# Patient Record
Sex: Female | Born: 1975 | Race: White | Hispanic: No | Marital: Married | Smoking: Never smoker
Health system: Southern US, Community
[De-identification: ages and names within clinical notes are randomized; demographics above are authoritative.]

## PROBLEM LIST (undated history)

## (undated) DIAGNOSIS — F419 Anxiety disorder, unspecified: Secondary | ICD-10-CM

## (undated) DIAGNOSIS — I10 Essential (primary) hypertension: Secondary | ICD-10-CM

## (undated) HISTORY — PX: WISDOM TOOTH EXTRACTION: SHX21

## (undated) HISTORY — DX: Anxiety disorder, unspecified: F41.9

## (undated) HISTORY — PX: OTHER SURGICAL HISTORY: SHX169

---

## 2018-05-17 ENCOUNTER — Emergency Department (HOSPITAL_COMMUNITY)
Admission: EM | Admit: 2018-05-17 | Discharge: 2018-05-17 | Disposition: A | Discharge status: Home / Self Care | Payer: BC Managed Care – PPO | Attending: Emergency Medicine | Admitting: Emergency Medicine

## 2018-05-17 ENCOUNTER — Emergency Department (HOSPITAL_COMMUNITY): Payer: BC Managed Care – PPO

## 2018-05-17 ENCOUNTER — Encounter (HOSPITAL_COMMUNITY): Payer: Self-pay | Admitting: Emergency Medicine

## 2018-05-17 DIAGNOSIS — R1031 Right lower quadrant pain: Secondary | ICD-10-CM | POA: Diagnosis present

## 2018-05-17 DIAGNOSIS — R030 Elevated blood-pressure reading, without diagnosis of hypertension: Secondary | ICD-10-CM

## 2018-05-17 DIAGNOSIS — N200 Calculus of kidney: Secondary | ICD-10-CM | POA: Insufficient documentation

## 2018-05-17 LAB — CBC
HCT: 40.5 % (ref 36.0–46.0)
Hemoglobin: 13.2 g/dL (ref 12.0–15.0)
MCH: 30.5 pg (ref 26.0–34.0)
MCHC: 32.6 g/dL (ref 30.0–36.0)
MCV: 93.5 fL (ref 80.0–100.0)
Platelets: 270 10*3/uL (ref 150–400)
RBC: 4.33 MIL/uL (ref 3.87–5.11)
RDW: 12.6 % (ref 11.5–15.5)
WBC: 5.7 10*3/uL (ref 4.0–10.5)
nRBC: 0 % (ref 0.0–0.2)

## 2018-05-17 LAB — COMPREHENSIVE METABOLIC PANEL
ALT: 15 U/L (ref 0–44)
AST: 20 U/L (ref 15–41)
Albumin: 3.9 g/dL (ref 3.5–5.0)
Alkaline Phosphatase: 71 U/L (ref 38–126)
Anion gap: 15 (ref 5–15)
BUN: 11 mg/dL (ref 6–20)
CO2: 21 mmol/L — ABNORMAL LOW (ref 22–32)
Calcium: 9.2 mg/dL (ref 8.9–10.3)
Chloride: 106 mmol/L (ref 98–111)
Creatinine, Ser: 0.97 mg/dL (ref 0.44–1.00)
GFR calc Af Amer: >60 mL/min (ref >60)
GFR calc non Af Amer: >60 mL/min (ref >60)
Glucose, Bld: 128 mg/dL — ABNORMAL HIGH (ref 70–99)
Potassium: 3.3 mmol/L — ABNORMAL LOW (ref 3.5–5.1)
Sodium: 142 mmol/L (ref 135–145)
Total Bilirubin: 0.3 mg/dL (ref 0.3–1.2)
Total Protein: 7.1 g/dL (ref 6.5–8.1)

## 2018-05-17 LAB — URINALYSIS, ROUTINE W REFLEX MICROSCOPIC
BILIRUBIN URINE: NEGATIVE
Bacteria, UA: NONE SEEN
Glucose, UA: NEGATIVE mg/dL
Ketones, ur: NEGATIVE mg/dL
Leukocytes,Ua: NEGATIVE
Nitrite: NEGATIVE
Protein, ur: NEGATIVE mg/dL
Specific Gravity, Urine: 1.013 (ref 1.005–1.030)
pH: 6 (ref 5.0–8.0)

## 2018-05-17 LAB — LIPASE, BLOOD: LIPASE: 28 U/L (ref 11–51)

## 2018-05-17 LAB — I-STAT BETA HCG BLOOD, ED (MC, WL, AP ONLY): I-stat hCG, quantitative: <5 m[IU]/mL (ref <5)

## 2018-05-17 MED ORDER — DICLOFENAC SODIUM ER 100 MG PO TB24: 100.0000 mg | ORAL_TABLET | Freq: Every day | ORAL | 0 refills | Status: AC

## 2018-05-17 MED ORDER — OXYCODONE-ACETAMINOPHEN 5-325 MG PO TABS: 1.0000 | ORAL_TABLET | Freq: Four times a day (QID) | ORAL | 0 refills | Status: DC | PRN

## 2018-05-17 MED ORDER — ONDANSETRON 4 MG PO TBDP: 4.0000 mg | ORAL_TABLET | Freq: Three times a day (TID) | ORAL | 0 refills | Status: DC | PRN

## 2018-05-17 MED ORDER — KETOROLAC TROMETHAMINE 30 MG/ML IJ SOLN
30.0000 mg | Freq: Once | INTRAMUSCULAR | Status: AC
  Administered 2018-05-17: 30 mg via INTRAVENOUS
  Filled 2018-05-17: qty 1

## 2018-05-17 MED ORDER — TAMSULOSIN HCL 0.4 MG PO CAPS: 0.4000 mg | ORAL_CAPSULE | Freq: Every day | ORAL | 0 refills | Status: AC

## 2018-05-17 MED ORDER — SODIUM CHLORIDE 0.9% FLUSH
3.0000 mL | Freq: Once | INTRAVENOUS | Status: AC
  Administered 2018-05-17: 3 mL via INTRAVENOUS

## 2018-05-17 MED ORDER — SODIUM CHLORIDE 0.9 % IV BOLUS
1000.0000 mL | Freq: Once | INTRAVENOUS | Status: AC
  Administered 2018-05-17: 1000 mL via INTRAVENOUS

## 2018-05-17 NOTE — Discharge Instructions (Addendum)
Your evaluated today for flank and abdominal pain.  Your CT scan did show evidence of a kidney stone on the right side.    I have prescribed Voltaren.  Please take this once daily in the morning.  Please take this with additional of a high carb meal.  I have also given you Percocet for breakthrough pain.  Please only take as needed.  Do not drive or operate heavy machinery while taking this medication.  I have also given you Zofran for nausea.  I would suggest taking this when you first wake up in the morning.  Also prescribed you Flomax.  Please make sure to sit on the edge of the bed before standing as this may cause dizziness.  We have provided you with a strainer.  Please continue to strain your urine until your symptoms resolve.  If you notice you pass a stone please place this in a bag and bring this to your follow-up appointment.  Follow-up with [facility].  Their phone number is on your discharge paperwork.  You need to call to schedule an appointment.  You had elevated blood pressure in the emergency department.  Please follow-up with a primary care provider for reevaluation.  I have also given you a Dash diet.  Return to the ED for any new or worsening symptoms.

## 2018-05-17 NOTE — ED Provider Notes (Signed)
[facility]   CSN: [id number] Arrival date & time: 05/17/18  [id number]  History   Chief Complaint Chief Complaint  Patient presents with  . Abdominal Pain   HPI Tanya Raymond is a 43 y.o. female with no significant past medical history who presents for evaluation of abdominal pain.  Patient states she was awoken in the middle of the night with right-sided flank pain which radiated to her right lower abdomen.  States pain is intermittent nature.  Pain on arrival was 10/10.  Her current pain is a 3/10.  Describes it as a sharp stabbing as well as an aching pain. Denies previous history of kidney stones.  Does have prior history of pyelonephritis.  States she is also had increased urgency to urinate, however has not had difficulty with urination.  No fever, chills, chest pain, shortness of breath, diarrhea, constipation, pelvic pain, vaginal discharge, hematuria, dysuria.  Patient states she is also had nausea as well as 2 episodes of nonbloody, nonbilious emesis.  No prior abdominal surgeries.  History provided by patient.  No interpreter was used.  HPI  History reviewed. No pertinent past medical history.  There are no active problems to display for this patient.   History reviewed. No pertinent surgical history.   OB History   No obstetric history on file.      Home Medications    Prior to Admission medications   Medication Sig Start Date End Date Taking? Authorizing Provider  Diclofenac Sodium CR 100 MG 24 hr tablet Take 1 tablet (100 mg total) by mouth daily for 7 days. 05/17/18 05/24/18  Naomy Esham A, PA-C  ondansetron (ZOFRAN ODT) 4 MG disintegrating tablet Take 1 tablet (4 mg total) by mouth every 8 (eight) hours as needed for nausea or vomiting. 05/17/18   Jojuan Champney A, PA-C  oxyCODONE-acetaminophen (PERCOCET/ROXICET) 5-325 MG tablet Take 1-2 tablets by mouth every 6 (six) hours as needed for severe pain. 05/17/18    Caelynn Marshman A, PA-C  tamsulosin (FLOMAX) 0.4 MG CAPS capsule Take 1 capsule (0.4 mg total) by mouth daily for 7 days. 05/17/18 05/24/18  Lynsey Ange A, PA-C    Family History No family history on file.  Social History Social History   Tobacco Use  . Smoking status: Never Smoker  . Smokeless tobacco: Never Used  Substance Use Topics  . Alcohol use: Yes    Comment: Socially   . Drug use: Not Currently     Allergies   Patient has no known allergies.   Review of Systems Review of Systems  Constitutional: Negative.   HENT: Negative.   Respiratory: Negative.   Cardiovascular: Negative.   Gastrointestinal: Positive for abdominal pain, nausea and vomiting. Negative for abdominal distention, anal bleeding, blood in stool, constipation, diarrhea and rectal pain.  Genitourinary: Positive for flank pain and urgency. Negative for decreased urine volume, difficulty urinating, dysuria, vaginal bleeding, vaginal discharge and vaginal pain.  Skin: Negative.   Neurological: Negative.   All other systems reviewed and are negative.    Physical Exam Updated Vital Signs BP (!) 144/96 (BP Location: Right Arm)   Pulse 80   Temp 98.8 F (37.1 C) (Oral)   Resp 18   Ht 5\' 6"  (1.676 m)   Wt 95.3 kg   LMP 04/23/2018   SpO2 100%   BMI 33.89 kg/m   Physical Exam Vitals signs and nursing note reviewed.  Constitutional:      General: She is not in  acute distress.    Appearance: She is well-developed. She is not ill-appearing, toxic-appearing or diaphoretic.  HENT:     Head: Normocephalic and atraumatic.     Mouth/Throat:     Comments: Posterior oropharynx clear.  Mucous membranes moist. Eyes:     Pupils: Pupils are equal, round, and reactive to light.  Neck:     Musculoskeletal: Normal range of motion.  Cardiovascular:     Rate and Rhythm: Normal rate.     Heart sounds: Normal heart sounds.  Pulmonary:     Effort: No respiratory distress.     Comments: Clear to  auscultation bilaterally without wheeze, rhonchi or rales.  Able to speak in full sentences without difficulty. Abdominal:     General: There is no distension.     Comments: Soft, Nontender without rebound or guarding.  No CVA tenderness.  Negative McBurney's point, negative psoas obturator sign. Normoactive bowel sounds  Musculoskeletal: Normal range of motion.  Skin:    General: Skin is warm and dry.     Comments: No rashes or lesions   Neurological:     Mental Status: She is alert.      ED Treatments / Results  Labs (all labs ordered are listed, but only abnormal results are displayed) Labs Reviewed  COMPREHENSIVE METABOLIC PANEL - Abnormal; Notable for the following components:      Result Value   Potassium 3.3 (*)    CO2 21 (*)    Glucose, Bld 128 (*)    All other components within normal limits  URINALYSIS, ROUTINE W REFLEX MICROSCOPIC - Abnormal; Notable for the following components:   Color, Urine STRAW (*)    Hgb urine dipstick MODERATE (*)    All other components within normal limits  LIPASE, BLOOD  CBC  I-STAT BETA HCG BLOOD, ED ([facility], [facility], [facility] ONLY)    EKG None  Radiology Ct Renal Stone Study  Result Date: 05/17/2018 CLINICAL DATA:  Right lower quadrant pain EXAM: CT ABDOMEN AND PELVIS WITHOUT CONTRAST TECHNIQUE: Multidetector CT imaging of the abdomen and pelvis was performed following the standard protocol without IV contrast. COMPARISON:  None. FINDINGS: Lower chest:  No significant finding Hepatobiliary: No focal liver abnormality.No evidence of biliary obstruction or stone. Pancreas: Unremarkable. Spleen: Unremarkable. Adrenals/Urinary Tract: Negative adrenals. 5 x 3 mm stone at the right UVJ with hydroureteronephrosis. Three right and single left intrarenal calculi measuring up to 4 mm on the right. Unremarkable bladder. Stomach/Bowel: Small bowel is clustered in the right abdomen and the colon on the left. The ileocecal valve is in the low abdominal midline. No  appendicitis. Vascular/Lymphatic: No acute vascular abnormality. No mass or adenopathy. Reproductive:Negative. Other: No ascites or pneumoperitoneum. Musculoskeletal: L4-5 focal degenerative disc narrowing. IMPRESSION: 1. Right hydroureteronephrosis due to 5 x 3 mm stone at the UVJ. 2. Right more than left nephrolithiasis. 3. Congenital non rotation of bowel. Electronically Signed   By: Marnee Spring M.D.   On: 05/17/2018 07:26    Procedures Procedures (including critical care time)  Medications Ordered in ED Medications  sodium chloride flush (NS) 0.9 % injection 3 mL (3 mLs Intravenous Given 05/17/18 0642)  sodium chloride 0.9 % bolus 1,000 mL (0 mLs Intravenous Stopped 05/17/18 0802)  ketorolac (TORADOL) 30 MG/ML injection 30 mg (30 mg Intravenous Given 05/17/18 4098)   Initial Impression / Assessment and Plan / ED Course  I have reviewed the triage vital signs and the nursing notes.  Pertinent labs & imaging results that were available during  my care of the patient were reviewed by me and considered in my medical decision making (see chart for details).  43 year old female who appears otherwise well presents for evaluation of right-sided flank pain as well as right-sided abdominal pain.  Afebrile, nonseptic, non-ill-appearing.  Pain intermittent in nature described as stabbing and aching.  No history of kidney stones. Abdomen soft, nontender without rebound or guarding.  Negative McBurney point, negative psoas obturator sign.  Episodes of nausea as well as nonbloody, nonbilious emesis, last episode at 3am.  Has not taken anything for pain. Urinary urgency without dysuria.  Labs ordered from triage.  CBC without leukocytosis, and Bolick panel with mild hypo-kalemia 3.3, mild elevation in glucose at 128, lipase 28, hCG negative.  Urinalysis without evidence of infection, moderate blood.  Will obtain CT renal stone and reevaluate. Low suspicion for torsion, appendicitis at this time.  0730: CT  scan with 4 mm stone with hydroureteronephrosis.  Able to urinate without difficulty.  Patient states her pain is now a 2/10 after Toradol.  Creatinine within normal limits.  No episodes of emesis in department.  Denies nausea.  Discussed follow-up with urology for kidney stone.  Patient will be given antiemetics, pain medication as well as strainer.  Able to tolerate p.o. intake in department without difficulty.  Patient has been hypertensive in department.  Blood pressure with improvement after pain medication.  Does not have history of hypertension.  Asymptomatic without headache, vision changes, chest pain, shortness of breath.  Discussed follow-up with PCP for reevaluation for her blood pressure.  Patient given DASH diet at discharge.  Patient hemodynamically stable and appropriate for DC home at this time.  I have discussed return precautions.  Patient voiced understanding and is agreeable for follow-up.    Final Clinical Impressions(s) / ED Diagnoses   Final diagnoses:  Nephrolithiasis  Elevated blood-pressure reading without diagnosis of hypertension    ED Discharge Orders         Ordered    Diclofenac Sodium CR 100 MG 24 hr tablet  Daily     05/17/18 0759    oxyCODONE-acetaminophen (PERCOCET/ROXICET) 5-325 MG tablet  Every 6 hours PRN     05/17/18 0759    ondansetron (ZOFRAN ODT) 4 MG disintegrating tablet  Every 8 hours PRN     05/17/18 0759    tamsulosin (FLOMAX) 0.4 MG CAPS capsule  Daily     05/17/18 0759           Lincoln Ginley A, PA-C 05/17/18 0817    Palumbo, April, MD 05/17/18 2310

## 2018-05-17 NOTE — ED Triage Notes (Signed)
Pt reports RLQ abd pain, N/V that woke her up from sleep. Pt appears to be in discomfort.

## 2018-05-17 NOTE — ED Notes (Signed)
Gave pt strainer for urine. Pt received discharge instructions/prescriptions. Pt agreeable to discharge and understands instructions. E signature not available.

## 2018-12-25 ENCOUNTER — Other Ambulatory Visit: Payer: Self-pay

## 2018-12-27 ENCOUNTER — Encounter: Payer: Self-pay | Admitting: Obstetrics and Gynecology

## 2019-01-17 ENCOUNTER — Encounter: Payer: Self-pay | Admitting: Obstetrics and Gynecology

## 2019-01-17 ENCOUNTER — Ambulatory Visit: Payer: BC Managed Care – PPO | Admitting: Obstetrics and Gynecology

## 2019-01-17 ENCOUNTER — Other Ambulatory Visit: Payer: Self-pay

## 2019-01-17 ENCOUNTER — Other Ambulatory Visit: Payer: Self-pay | Admitting: Obstetrics and Gynecology

## 2019-01-17 ENCOUNTER — Other Ambulatory Visit: Payer: Self-pay | Admitting: Internal Medicine

## 2019-01-17 VITALS — BP 140/86 | HR 80 | Temp 97.1°F | Ht 66.0 in | Wt 225.8 lb

## 2019-01-17 DIAGNOSIS — Z01419 Encounter for gynecological examination (general) (routine) without abnormal findings: Secondary | ICD-10-CM

## 2019-01-17 DIAGNOSIS — Z1231 Encounter for screening mammogram for malignant neoplasm of breast: Secondary | ICD-10-CM

## 2019-01-17 DIAGNOSIS — Z6836 Body mass index (BMI) 36.0-36.9, adult: Secondary | ICD-10-CM

## 2019-01-17 DIAGNOSIS — Z124 Encounter for screening for malignant neoplasm of cervix: Secondary | ICD-10-CM | POA: Diagnosis not present

## 2019-01-17 DIAGNOSIS — Z3009 Encounter for other general counseling and advice on contraception: Secondary | ICD-10-CM

## 2019-01-17 DIAGNOSIS — R030 Elevated blood-pressure reading, without diagnosis of hypertension: Secondary | ICD-10-CM

## 2019-01-17 NOTE — Patient Instructions (Addendum)
EXERCISE AND DIET:  We recommended that you start or continue a regular exercise program for good health. Regular exercise means any activity that makes your heart beat faster and makes you sweat.  We recommend exercising at least 30 minutes per day at least 3 days a week, preferably 4 or 5.  We also recommend a diet low in fat and sugar.  Inactivity, poor dietary choices and obesity can cause diabetes, heart attack, stroke, and kidney damage, among others.    ALCOHOL AND SMOKING:  Women should limit their alcohol intake to no more than 7 drinks/beers/glasses of wine (combined, not each!) per week. Moderation of alcohol intake to this level decreases your risk of breast cancer and liver damage. And of course, no recreational drugs are part of a healthy lifestyle.  And absolutely no smoking or even second hand smoke. Most people know smoking can cause heart and lung diseases, but did you know it also contributes to weakening of your bones? Aging of your skin?  Yellowing of your teeth and nails?  CALCIUM AND VITAMIN D:  Adequate intake of calcium and Vitamin D are recommended.  The recommendations for exact amounts of these supplements seem to change often, but generally speaking 1,000 mg of calcium (between diet and supplement) and 800 units of Vitamin D per day seems prudent. Certain women may benefit from higher intake of Vitamin D.  If you are among these women, your doctor will have told you during your visit.    PAP SMEARS:  Pap smears, to check for cervical cancer or precancers,  have traditionally been done yearly, although recent scientific advances have shown that most women can have pap smears less often.  However, every woman still should have a physical exam from her gynecologist every year. It will include a breast check, inspection of the vulva and vagina to check for abnormal growths or skin changes, a visual exam of the cervix, and then an exam to evaluate the size and shape of the uterus and  ovaries.  And after 40 years of age, a rectal exam is indicated to check for rectal cancers. We will also provide age appropriate advice regarding health maintenance, like when you should have certain vaccines, screening for sexually transmitted diseases, bone density testing, colonoscopy, mammograms, etc.   MAMMOGRAMS:  All women over 40 years old should have a yearly mammogram. Many facilities now offer a "3D" mammogram, which may cost around $50 extra out of pocket. If possible,  we recommend you accept the option to have the 3D mammogram performed.  It both reduces the number of women who will be called back for extra views which then turn out to be normal, and it is better than the routine mammogram at detecting truly abnormal areas.    COLON CANCER SCREENING: Now recommend starting at age 45. At this time colonoscopy is not covered for routine screening until 50. There are take home tests that can be done between 45-49.   COLONOSCOPY:  Colonoscopy to screen for colon cancer is recommended for all women at age 50.  We know, you hate the idea of the prep.  We agree, BUT, having colon cancer and not knowing it is worse!!  Colon cancer so often starts as a polyp that can be seen and removed at colonscopy, which can quite literally save your life!  And if your first colonoscopy is normal and you have no family history of colon cancer, most women don't have to have it again for   10 years.  Once every ten years, you can do something that may end up saving your life, right?  We will be happy to help you get it scheduled when you are ready.  Be sure to check your insurance coverage so you understand how much it will cost.  It may be covered as a preventative service at no cost, but you should check your particular policy.      Breast Self-Awareness Breast self-awareness means being familiar with how your breasts look and feel. It involves checking your breasts regularly and reporting any changes to your  health care provider. Practicing breast self-awareness is important. A change in your breasts can be a sign of a serious medical problem. Being familiar with how your breasts look and feel allows you to find any problems early, when treatment is more likely to be successful. All women should practice breast self-awareness, including women who have had breast implants. How to do a breast self-exam One way to learn what is normal for your breasts and whether your breasts are changing is to do a breast self-exam. To do a breast self-exam: Look for Changes  1. Remove all the clothing above your waist. 2. Stand in front of a mirror in a room with good lighting. 3. Put your hands on your hips. 4. Push your hands firmly downward. 5. Compare your breasts in the mirror. Look for differences between them (asymmetry), such as: ? Differences in shape. ? Differences in size. ? Puckers, dips, and bumps in one breast and not the other. 6. Look at each breast for changes in your skin, such as: ? Redness. ? Scaly areas. 7. Look for changes in your nipples, such as: ? Discharge. ? Bleeding. ? Dimpling. ? Redness. ? A change in position. Feel for Changes Carefully feel your breasts for lumps and changes. It is best to do this while lying on your back on the floor and again while sitting or standing in the shower or tub with soapy water on your skin. Feel each breast in the following way:  Place the arm on the side of the breast you are examining above your head.  Feel your breast with the other hand.  Start in the nipple area and make  inch (2 cm) overlapping circles to feel your breast. Use the pads of your three middle fingers to do this. Apply light pressure, then medium pressure, then firm pressure. The light pressure will allow you to feel the tissue closest to the skin. The medium pressure will allow you to feel the tissue that is a little deeper. The firm pressure will allow you to feel the tissue  close to the ribs.  Continue the overlapping circles, moving downward over the breast until you feel your ribs below your breast.  Move one finger-width toward the center of the body. Continue to use the  inch (2 cm) overlapping circles to feel your breast as you move slowly up toward your collarbone.  Continue the up and down exam using all three pressures until you reach your armpit.  Write Down What You Find  Write down what is normal for each breast and any changes that you find. Keep a written record with breast changes or normal findings for each breast. By writing this information down, you do not need to depend only on memory for size, tenderness, or location. Write down where you are in your menstrual cycle, if you are still menstruating. If you are having trouble noticing differences   in your breasts, do not get discouraged. With time you will become more familiar with the variations in your breasts and more comfortable with the exam. How often should I examine my breasts? Examine your breasts every month. If you are breastfeeding, the best time to examine your breasts is after a feeding or after using a breast pump. If you menstruate, the best time to examine your breasts is 5-7 days after your period is over. During your period, your breasts are lumpier, and it may be more difficult to notice changes. When should I see my health care provider? See your health care provider if you notice:  A change in shape or size of your breasts or nipples.  A change in the skin of your breast or nipples, such as a reddened or scaly area.  Unusual discharge from your nipples.  A lump or thick area that was not there before.  Pain in your breasts.  Anything that concerns you.  Hypertension, Adult High blood pressure (hypertension) is when the force of blood pumping through the arteries is too strong. The arteries are the blood vessels that carry blood from the heart throughout the body.  Hypertension forces the heart to work harder to pump blood and may cause arteries to become narrow or stiff. Untreated or uncontrolled hypertension can cause a heart attack, heart failure, a stroke, kidney disease, and other problems. A blood pressure reading consists of a higher number over a lower number. Ideally, your blood pressure should be below 120/80. The first ("top") number is called the systolic pressure. It is a measure of the pressure in your arteries as your heart beats. The second ("bottom") number is called the diastolic pressure. It is a measure of the pressure in your arteries as the heart relaxes. What are the causes? The exact cause of this condition is not known. There are some conditions that result in or are related to high blood pressure. What increases the risk? Some risk factors for high blood pressure are under your control. The following factors may make you more likely to develop this condition:  Smoking.  Having type 2 diabetes mellitus, high cholesterol, or both.  Not getting enough exercise or physical activity.  Being overweight.  Having too much fat, sugar, calories, or salt (sodium) in your diet.  Drinking too much alcohol. Some risk factors for high blood pressure may be difficult or impossible to change. Some of these factors include:  Having chronic kidney disease.  Having a family history of high blood pressure.  Age. Risk increases with age.  Race. You may be at higher risk if you are African American.  Gender. Men are at higher risk than women before age 31. After age 80, women are at higher risk than men.  Having obstructive sleep apnea.  Stress. What are the signs or symptoms? High blood pressure may not cause symptoms. Very high blood pressure (hypertensive crisis) may cause:  Headache.  Anxiety.  Shortness of breath.  Nosebleed.  Nausea and vomiting.  Vision changes.  Severe chest pain.  Seizures. How is this diagnosed?  This condition is diagnosed by measuring your blood pressure while you are seated, with your arm resting on a flat surface, your legs uncrossed, and your feet flat on the floor. The cuff of the blood pressure monitor will be placed directly against the skin of your upper arm at the level of your heart. It should be measured at least twice using the same arm. Certain conditions can  cause a difference in blood pressure between your right and left arms. Certain factors can cause blood pressure readings to be lower or higher than normal for a short period of time:  When your blood pressure is higher when you are in a health care provider's office than when you are at home, this is called white coat hypertension. Most people with this condition do not need medicines.  When your blood pressure is higher at home than when you are in a health care provider's office, this is called masked hypertension. Most people with this condition may need medicines to control blood pressure. If you have a high blood pressure reading during one visit or you have normal blood pressure with other risk factors, you may be asked to:  Return on a different day to have your blood pressure checked again.  Monitor your blood pressure at home for 1 week or longer. If you are diagnosed with hypertension, you may have other blood or imaging tests to help your health care provider understand your overall risk for other conditions. How is this treated? This condition is treated by making healthy lifestyle changes, such as eating healthy foods, exercising more, and reducing your alcohol intake. Your health care provider may prescribe medicine if lifestyle changes are not enough to get your blood pressure under control, and if:  Your systolic blood pressure is above 130.  Your diastolic blood pressure is above 80. Your personal target blood pressure may vary depending on your medical conditions, your age, and other factors. Follow  these instructions at home: Eating and drinking   Eat a diet that is high in fiber and potassium, and low in sodium, added sugar, and fat. An example eating plan is called the DASH (Dietary Approaches to Stop Hypertension) diet. To eat this way: ? Eat plenty of fresh fruits and vegetables. Try to fill one half of your plate at each meal with fruits and vegetables. ? Eat whole grains, such as whole-wheat pasta, brown rice, or whole-grain bread. Fill about one fourth of your plate with whole grains. ? Eat or drink low-fat dairy products, such as skim milk or low-fat yogurt. ? Avoid fatty cuts of meat, processed or cured meats, and poultry with skin. Fill about one fourth of your plate with lean proteins, such as fish, chicken without skin, beans, eggs, or tofu. ? Avoid pre-made and processed foods. These tend to be higher in sodium, added sugar, and fat.  Reduce your daily sodium intake. Most people with hypertension should eat less than 1,500 mg of sodium a day.  Do not drink alcohol if: ? Your health care provider tells you not to drink. ? You are pregnant, may be pregnant, or are planning to become pregnant.  If you drink alcohol: ? Limit how much you use to:  0-1 drink a day for women.  0-2 drinks a day for men. ? Be aware of how much alcohol is in your drink. In the [place] one drink equals one 12 oz bottle of beer (355 mL), one 5 oz glass of wine (148 mL), or one 1 oz glass of hard liquor (44 mL). Lifestyle   Work with your health care provider to maintain a healthy body weight or to lose weight. Ask what an ideal weight is for you.  Get at least 30 minutes of exercise most days of the week. Activities may include walking, swimming, or biking.  Include exercise to strengthen your muscles (resistance exercise), such as Pilates or lifting  weights, as part of your weekly exercise routine. Try to do these types of exercises for 30 minutes at least 3 days a week.  Do not use any  products that contain nicotine or tobacco, such as cigarettes, e-cigarettes, and chewing tobacco. If you need help quitting, ask your health care provider.  Monitor your blood pressure at home as told by your health care provider.  Keep all follow-up visits as told by your health care provider. This is important. Medicines  Take over-the-counter and prescription medicines only as told by your health care provider. Follow directions carefully. Blood pressure medicines must be taken as prescribed.  Do not skip doses of blood pressure medicine. Doing this puts you at risk for problems and can make the medicine less effective.  Ask your health care provider about side effects or reactions to medicines that you should watch for. Contact a health care provider if you:  Think you are having a reaction to a medicine you are taking.  Have headaches that keep coming back (recurring).  Feel dizzy.  Have swelling in your ankles.  Have trouble with your vision. Get help right away if you:  Develop a severe headache or confusion.  Have unusual weakness or numbness.  Feel faint.  Have severe pain in your chest or abdomen.  Vomit repeatedly.  Have trouble breathing. Summary  Hypertension is when the force of blood pumping through your arteries is too strong. If this condition is not controlled, it may put you at risk for serious complications.  Your personal target blood pressure may vary depending on your medical conditions, your age, and other factors. For most people, a normal blood pressure is less than 120/80.  Hypertension is treated with lifestyle changes, medicines, or a combination of both. Lifestyle changes include losing weight, eating a healthy, low-sodium diet, exercising more, and limiting alcohol. This information is not intended to replace advice given to you by your health care provider. Make sure you discuss any questions you have with your health care provider. Document  Released: 03/21/2005 Document Revised: 11/29/2017 Document Reviewed: 11/29/2017 Elsevier Patient Education  2020 [employer]

## 2019-01-17 NOTE — Progress Notes (Addendum)
43 y.o. [id number] White or Caucasian Not Hispanic or Latino female here for annual exam.  On OCP's. No dyspareunia.   Her BP has been high recently. She has an appointment to be evaluated by a primary. She has diet and is exercising, has lost ~7 lbs. Doing low carb.   Period Cycle (Days): 28 Period Duration (Days): 3-4 days Period Pattern: Regular Menstrual Flow: Moderate Menstrual Control: Tampon Menstrual Control Change Freq (Hours): changes tampon every 4 hours Dysmenorrhea: None  Patient's last menstrual period was 01/08/2019 (exact date).          Sexually active: Yes.    The current method of family planning is OCP (estrogen/progesterone).    Exercising: Yes.    walking Smoker:  no  Health Maintenance: Pap:  2019 WNL per patient  History of abnormal Pap:  no MMG:  Never  BMD:   Never Colonoscopy: Never TDaP:  UTD per patient Gardasil: No   reports that she has never smoked. She has never used smokeless tobacco. She reports current alcohol use. She reports previous drug use. Social ETOH. She works in Advertising account executive, Arts administrator. Daughter is 8. Husband is a Professor  Past Medical History:  Diagnosis Date  . Anxiety   Doing well, better than when she was on medication.  History reviewed. No pertinent surgical history.  Current Outpatient Medications  Medication Sig Dispense Refill  . MAGNESIUM PO Take by mouth.    . Multiple Vitamin (MULTIVITAMIN PO) Take by mouth.    . norethindrone-ethinyl estradiol (JUNEL FE 1/20) 1-20 MG-MCG tablet Take 1 tablet by mouth daily.     No current facility-administered medications for this visit.     Family History  Problem Relation Age of Onset  . Hypertension Mother   . Hypertension Father   . Heart attack Maternal Grandfather   . Heart attack Paternal Grandmother   . Heart attack Paternal Grandfather     Review of Systems  Constitutional: Negative.   HENT: Negative.   Eyes: Negative.   Respiratory: Negative.    Cardiovascular: Negative.   Gastrointestinal: Negative.   Endocrine: Negative.   Genitourinary: Negative.   Musculoskeletal: Negative.   Skin: Negative.   Allergic/Immunologic: Negative.   Neurological: Negative.   Hematological: Negative.   Psychiatric/Behavioral: Negative.     Exam:   BP 140/86 (BP Location: Right Arm, Patient Position: Sitting, Cuff Size: Large)   Pulse 80   Temp (!) 97.1 F (36.2 C) (Skin)   Ht 5\' 6"  (1.676 m)   Wt 225 lb 12.8 oz (102.4 kg)   LMP 01/08/2019 (Exact Date)   BMI 36.45 kg/m   Weight change: @WEIGHTCHANGE @ Height:   Height: 5\' 6"  (167.6 cm)  Ht Readings from Last 3 Encounters:  01/17/19 5\' 6"  (1.676 m)  05/17/18 5\' 6"  (1.676 m)    General appearance: alert, cooperative and appears stated age Head: Normocephalic, without obvious abnormality, atraumatic Neck: no adenopathy, supple, symmetrical, trachea midline and thyroid normal to inspection and palpation Lungs: clear to auscultation bilaterally Cardiovascular: regular rate and rhythm Breasts: normal appearance, no masses or tenderness Abdomen: soft, non-tender; non distended,  no masses,  no organomegaly Extremities: extremities normal, atraumatic, no cyanosis or edema Skin: Skin color, texture, turgor normal. No rashes or lesions Lymph nodes: Cervical, supraclavicular, and axillary nodes normal. No abnormal inguinal nodes palpated Neurologic: Grossly normal   Pelvic: External genitalia:  no lesions              Urethra:  normal appearing urethra with  no masses, tenderness or lesions              Bartholins and Skenes: normal                 Vagina: normal appearing vagina with normal color and discharge, no lesions              Cervix: no lesions and friable with pap               Bimanual Exam:  Uterus:  normal size, contour, position, consistency, mobility, non-tender and anteverted to mid position, deep in the pelvis.               Adnexa: no mass, fullness, tenderness                Rectovaginal: Confirms               Anus:  normal sphincter tone, no lesions  Chaperone was present for exam.  A:  Well Woman with normal exam  Elevated BP without diagnosis of HTN, has been elevated the last few times she has checked it.   P:   Recommended she go off OCP's  She has an appointment with a primary to discuss her BP  Will do labs with primary  She has changed her diet and is exercising, working on weight loss  Return for Mirena IUD, discussed risks and side effects.   Mammogram, # given  Pap with hpv  Addendum: the patient decided not to send the pap, will get a copy of her last pap.   01/21/19 addendum: Pap report from 12/29/2017 reviewed and normal. No hpv testing done.

## 2019-01-18 ENCOUNTER — Telehealth: Payer: Self-pay | Admitting: Obstetrics and Gynecology

## 2019-01-18 NOTE — Telephone Encounter (Signed)
Call placed to convey benefits for Mirena. °

## 2019-01-18 NOTE — Telephone Encounter (Signed)
Results and information printed and placed in Dr. Gentry Fitz folder for review.

## 2019-01-18 NOTE — Telephone Encounter (Signed)
Patient sent the following correspondence through [place].  Hi -  Last pap results and information.  Thanks, [employer]

## 2019-01-18 NOTE — Telephone Encounter (Signed)
Patient returned my call and I conveyed the benefits. Patient understands/agreeable with the benefits. Patient is aware of the cancellation policy. Appointment scheduled 01/24/19.

## 2019-01-23 ENCOUNTER — Other Ambulatory Visit: Payer: Self-pay

## 2019-01-24 ENCOUNTER — Encounter: Payer: Self-pay | Admitting: Obstetrics and Gynecology

## 2019-01-24 ENCOUNTER — Ambulatory Visit (INDEPENDENT_AMBULATORY_CARE_PROVIDER_SITE_OTHER): Payer: BC Managed Care – PPO | Admitting: Obstetrics and Gynecology

## 2019-01-24 VITALS — BP 138/90 | HR 76 | Temp 97.3°F | Wt 224.2 lb

## 2019-01-24 DIAGNOSIS — Z30430 Encounter for insertion of intrauterine contraceptive device: Secondary | ICD-10-CM | POA: Diagnosis not present

## 2019-01-24 DIAGNOSIS — Z3009 Encounter for other general counseling and advice on contraception: Secondary | ICD-10-CM

## 2019-01-24 DIAGNOSIS — Z01812 Encounter for preprocedural laboratory examination: Secondary | ICD-10-CM

## 2019-01-24 LAB — POCT URINE PREGNANCY: Preg Test, Ur: NEGATIVE

## 2019-01-24 NOTE — Patient Instructions (Signed)
IUD Post-procedure Instructions Cramping is common.  You may take Ibuprofen, Aleve, or Tylenol for the cramping.  This should resolve within 24 hours.   You may have a small amount of spotting.  You should wear a mini pad for the next few days. You may have intercourse in 24 hours. You need to call the office if you have any pelvic pain, fever, heavy bleeding, or foul smelling vaginal discharge. Shower or bathe as normal Use back up contraception for one week 

## 2019-01-24 NOTE — Progress Notes (Signed)
GYNECOLOGY  VISIT   HPI: 43 y.o.   Married White or Caucasian Not Hispanic or Latino  female    G1P1001 with Patient's last menstrual period was 01/08/2019 (exact date).   here for  Mirena IUD insertion.   GYNECOLOGIC HISTORY: Patient's last menstrual period was 01/08/2019 (exact date). Contraception:OCP Menopausal hormone therapy: None        OB History    Gravida  1   Para  1   Term  1   Preterm      AB      Living  1     SAB      TAB      Ectopic      Multiple      Live Births  1              There are no active problems to display for this patient.   Past Medical History:  Diagnosis Date  . Anxiety     History reviewed. No pertinent surgical history.  Current Outpatient Medications  Medication Sig Dispense Refill  . MAGNESIUM PO Take by mouth.    . Multiple Vitamin (MULTIVITAMIN PO) Take by mouth.    . norethindrone-ethinyl estradiol (JUNEL FE 1/20) 1-20 MG-MCG tablet Take 1 tablet by mouth daily.     No current facility-administered medications for this visit.      ALLERGIES: Patient has no known allergies.  Family History  Problem Relation Age of Onset  . Hypertension Mother   . Hypertension Father   . Heart attack Maternal Grandfather   . Heart attack Paternal Grandmother   . Heart attack Paternal Grandfather     Social History   Socioeconomic History  . Marital status: Married    Spouse name: Not on file  . Number of children: Not on file  . Years of education: Not on file  . Highest education level: Not on file  Occupational History  . Not on file  Social Needs  . Financial resource strain: Not on file  . Food insecurity    Worry: Not on file    Inability: Not on file  . Transportation needs    Medical: Not on file    Non-medical: Not on file  Tobacco Use  . Smoking status: Never Smoker  . Smokeless tobacco: Never Used  Substance and Sexual Activity  . Alcohol use: Yes    Comment: Socially   . Drug use: Not  Currently  . Sexual activity: Yes    Birth control/protection: Pill  Lifestyle  . Physical activity    Days per week: Not on file    Minutes per session: Not on file  . Stress: Not on file  Relationships  . Social Herbalist on phone: Not on file    Gets together: Not on file    Attends religious service: Not on file    Active member of club or organization: Not on file    Attends meetings of clubs or organizations: Not on file    Relationship status: Not on file  . Intimate partner violence    Fear of current or ex partner: Not on file    Emotionally abused: Not on file    Physically abused: Not on file    Forced sexual activity: Not on file  Other Topics Concern  . Not on file  Social History Narrative  . Not on file    Review of Systems  Constitutional: Negative.   HENT: Negative.  Eyes: Negative.   Respiratory: Negative.   Cardiovascular: Negative.   Gastrointestinal: Negative.   Genitourinary: Negative.   Musculoskeletal: Negative.   Skin: Negative.   Neurological: Negative.   Endo/Heme/Allergies: Negative.   Psychiatric/Behavioral: Negative.     PHYSICAL EXAMINATION:    BP 138/90 (BP Location: Right Arm, Patient Position: Sitting, Cuff Size: Large)   Pulse 76   Temp (!) 97.3 F (36.3 C) (Skin)   Wt 224 lb 3.2 oz (101.7 kg)   LMP 01/08/2019 (Exact Date)   BMI 36.19 kg/m     General appearance: alert, cooperative and appears stated age   Pelvic: External genitalia:  no lesions              Urethra:  normal appearing urethra with no masses, tenderness or lesions              Bartholins and Skenes: normal                 Vagina: normal appearing vagina with normal color and discharge, no lesions              Cervix: no lesions  The risks of the mirena IUD were reviewed with the patient, including infection, abnormal bleeding and uterine perfortion. Consent was signed.  A speculum was placed in the vagina, the cervix was cleansed with  betadine. A tenaculum was placed on the cervix, the uterus sounded to 7-8 cm. The cervix was dilated to a #5 hagar dilator  The mirena IUD was inserted without difficulty. The string were cut to 3-4 cm. The tenaculum was removed. Slight oozing from the tenaculum site was stopped with pressure and silver nitrate on the left side.   The patient tolerated the procedure well.   Chaperone was present for exam.  ASSESSMENT IUD insertion, mirena    PLAN F/U in one month   An After Visit Summary was printed and given to the patient.

## 2019-02-11 ENCOUNTER — Encounter: Payer: Self-pay | Admitting: Family

## 2019-02-11 ENCOUNTER — Other Ambulatory Visit: Payer: Self-pay

## 2019-02-11 ENCOUNTER — Other Ambulatory Visit (INDEPENDENT_AMBULATORY_CARE_PROVIDER_SITE_OTHER): Payer: BC Managed Care – PPO

## 2019-02-11 ENCOUNTER — Ambulatory Visit (INDEPENDENT_AMBULATORY_CARE_PROVIDER_SITE_OTHER): Payer: BC Managed Care – PPO | Admitting: Family

## 2019-02-11 VITALS — BP 140/82 | HR 67 | Temp 98.2°F | Ht 66.0 in | Wt 221.8 lb

## 2019-02-11 DIAGNOSIS — E559 Vitamin D deficiency, unspecified: Secondary | ICD-10-CM | POA: Diagnosis not present

## 2019-02-11 DIAGNOSIS — R5383 Other fatigue: Secondary | ICD-10-CM

## 2019-02-11 DIAGNOSIS — E7800 Pure hypercholesterolemia, unspecified: Secondary | ICD-10-CM

## 2019-02-11 LAB — LIPID PANEL
Cholesterol: 209 mg/dL — ABNORMAL HIGH (ref 0–200)
HDL: 43 mg/dL (ref >39.00)
LDL Cholesterol: 149 mg/dL — ABNORMAL HIGH (ref 0–99)
NonHDL: 165.79
Total CHOL/HDL Ratio: 5
Triglycerides: 86 mg/dL (ref 0.0–149.0)
VLDL: 17.2 mg/dL (ref 0.0–40.0)

## 2019-02-11 LAB — COMPREHENSIVE METABOLIC PANEL
ALT: 13 U/L (ref 0–35)
AST: 14 U/L (ref 0–37)
Albumin: 4.4 g/dL (ref 3.5–5.2)
Alkaline Phosphatase: 83 U/L (ref 39–117)
BUN: 11 mg/dL (ref 6–23)
CO2: 26 mEq/L (ref 19–32)
Calcium: 9.1 mg/dL (ref 8.4–10.5)
Chloride: 106 mEq/L (ref 96–112)
Creatinine, Ser: 0.82 mg/dL (ref 0.40–1.20)
GFR: 76.68 mL/min (ref >60.00)
Glucose, Bld: 111 mg/dL — ABNORMAL HIGH (ref 70–99)
Potassium: 3.8 mEq/L (ref 3.5–5.1)
Sodium: 141 mEq/L (ref 135–145)
Total Bilirubin: 0.3 mg/dL (ref 0.2–1.2)
Total Protein: 7.1 g/dL (ref 6.0–8.3)

## 2019-02-11 LAB — CBC WITH DIFFERENTIAL/PLATELET
Basophils Absolute: 0 10*3/uL (ref 0.0–0.1)
Basophils Relative: 0.4 % (ref 0.0–3.0)
Eosinophils Absolute: 0.1 10*3/uL (ref 0.0–0.7)
Eosinophils Relative: 3.4 % (ref 0.0–5.0)
HCT: 40.1 % (ref 36.0–46.0)
Hemoglobin: 13.3 g/dL (ref 12.0–15.0)
Lymphocytes Relative: 37 % (ref 12.0–46.0)
Lymphs Abs: 1.5 10*3/uL (ref 0.7–4.0)
MCHC: 33.2 g/dL (ref 30.0–36.0)
MCV: 92.1 fl (ref 78.0–100.0)
Monocytes Absolute: 0.5 10*3/uL (ref 0.1–1.0)
Monocytes Relative: 11.1 % (ref 3.0–12.0)
Neutro Abs: 2 10*3/uL (ref 1.4–7.7)
Neutrophils Relative %: 48.1 % (ref 43.0–77.0)
Platelets: 213 10*3/uL (ref 150.0–400.0)
RBC: 4.36 Mil/uL (ref 3.87–5.11)
RDW: 13.2 % (ref 11.5–15.5)
WBC: 4.1 10*3/uL (ref 4.0–10.5)

## 2019-02-11 LAB — TSH: TSH: 1.58 u[IU]/mL (ref 0.35–4.50)

## 2019-02-11 LAB — VITAMIN D 25 HYDROXY (VIT D DEFICIENCY, FRACTURES): VITD: 26.92 ng/mL — ABNORMAL LOW (ref 30.00–100.00)

## 2019-02-11 NOTE — Progress Notes (Signed)
Tanya Raymond is a 43 y.o. female with the following history as recorded in Epic[employer]:  There are no active problems to display for this patient.   Current Outpatient Medications  Medication Sig Dispense Refill  . levonorgestrel (MIRENA) 20 MCG/24HR IUD 1 each by Intrauterine route once.    Marland Kitchen MAGNESIUM PO Take by mouth.    . Multiple Vitamin (MULTIVITAMIN PO) Take by mouth.     No current facility-administered medications for this visit.     Allergies: Patient has no known allergies.  Past Medical History:  Diagnosis Date  . Anxiety     History reviewed. No pertinent surgical history.  Family History  Problem Relation Age of Onset  . Hypertension Mother   . Hypertension Father   . Heart attack Maternal Grandfather   . Heart attack Paternal Grandmother   . Heart attack Paternal Grandfather     Social History   Tobacco Use  . Smoking status: Never Smoker  . Smokeless tobacco: Never Used  Substance Use Topics  . Alcohol use: Yes    Comment: Socially     Subjective:  Presents today as a new patient; concerned that blood pressure has been mildly elevated; has already been working on losing weight and prefers not to start medication at this time; has stopped OCPs- switched to Mirena;  Walking daily 30-45 minutes;  Up to date on dentist and eye exam today;  Has lost 4 pounds in the past month;  Denies any chest pain, shortness of breath, blurred vision or headache     Objective:  Vitals:   02/11/19 0921  BP: 140/82  Pulse: 67  Temp: 98.2 F (36.8 C)  TempSrc: Oral  SpO2: 99%  Weight: 221 lb 12.8 oz (100.6 kg)  Height: 5' 6"  (1.676 m)    General: Well developed, well nourished, in no acute distress  Skin : Warm and dry.  Head: Normocephalic and atraumatic  Eyes: Sclera and conjunctiva clear; pupils round and reactive to light; extraocular movements intact  Ears: External normal; canals clear; tympanic membranes normal  Oropharynx: Pink, supple. No suspicious  lesions  Neck: Supple without thyromegaly, adenopathy  Lungs: Respirations unlabored; clear to auscultation bilaterally without wheeze, rales, rhonchi  CVS exam: normal rate and regular rhythm.  Neurologic: Alert and oriented; speech intact; face symmetrical; moves all extremities well; CNII-XII intact without focal deficit    Assessment:  1. Other fatigue   2. Elevated cholesterol   3. Vitamin D deficiency     Plan:  Will update labs today; DASH diet discussed; agree that do not have to start medication at this time; she will continue to work on weight loss goals/ diet changes; continue to check blood pressure 3-4 x per week and bring log to next OV; follow-up in 4 months.   Return in about 4 months (around 06/11/2019).  Orders Placed This Encounter  Procedures  . CBC w/Diff    Standing Status:   Future    Number of Occurrences:   1    Standing Expiration Date:   02/11/2020  . Comp Met (CMET)    Standing Status:   Future    Number of Occurrences:   1    Standing Expiration Date:   02/11/2020  . Lipid panel    Standing Status:   Future    Number of Occurrences:   1    Standing Expiration Date:   02/11/2020  . Vitamin D (25 hydroxy)    Standing Status:   Future  Number of Occurrences:   1    Standing Expiration Date:   02/11/2020  . TSH    Standing Status:   Future    Number of Occurrences:   1    Standing Expiration Date:   02/11/2020    Requested Prescriptions    No prescriptions requested or ordered in this encounter

## 2019-02-11 NOTE — Patient Instructions (Signed)

## 2019-03-06 ENCOUNTER — Other Ambulatory Visit: Payer: Self-pay

## 2019-03-06 ENCOUNTER — Ambulatory Visit
Admission: RE | Admit: 2019-03-06 | Discharge: 2019-03-06 | Disposition: A | Discharge status: Home / Self Care | Payer: BC Managed Care – PPO | Source: Ambulatory Visit | Attending: Obstetrics and Gynecology | Admitting: Obstetrics and Gynecology

## 2019-03-06 DIAGNOSIS — Z1231 Encounter for screening mammogram for malignant neoplasm of breast: Secondary | ICD-10-CM

## 2019-03-06 NOTE — Progress Notes (Signed)
GYNECOLOGY  VISIT   HPI: 43 y.o.   Married White or Caucasian Not Hispanic or Latino  female   G1P1001 with No LMP recorded. (Menstrual status: IUD).   here for IUD check. She had a mirena IUD inserted in 10/20. She has had light spotting until a few days ago. Occasional mild cramping, no dyspareunia.  GYNECOLOGIC HISTORY: No LMP recorded. (Menstrual status: IUD). Contraception: IUD Menopausal hormone therapy: None        OB History    Gravida  1   Para  1   Term  1   Preterm      AB      Living  1     SAB      TAB      Ectopic      Multiple      Live Births  1              There are no active problems to display for this patient.   Past Medical History:  Diagnosis Date  . Anxiety     History reviewed. No pertinent surgical history.  Current Outpatient Medications  Medication Sig Dispense Refill  . levonorgestrel (MIRENA) 20 MCG/24HR IUD 1 each by Intrauterine route once.    Marland Kitchen MAGNESIUM PO Take by mouth.    . Multiple Vitamin (MULTIVITAMIN PO) Take by mouth.    Marland Kitchen VITAMIN D PO Take by mouth.     No current facility-administered medications for this visit.      ALLERGIES: Patient has no known allergies.  Family History  Problem Relation Age of Onset  . Hypertension Mother   . Hypertension Father   . Heart attack Maternal Grandfather   . Heart attack Paternal Grandmother   . Heart attack Paternal Grandfather     Social History   Socioeconomic History  . Marital status: Married    Spouse name: Not on file  . Number of children: Not on file  . Years of education: Not on file  . Highest education level: Not on file  Occupational History  . Not on file  Social Needs  . Financial resource strain: Not on file  . Food insecurity    Worry: Not on file    Inability: Not on file  . Transportation needs    Medical: Not on file    Non-medical: Not on file  Tobacco Use  . Smoking status: Never Smoker  . Smokeless tobacco: Never Used   Substance and Sexual Activity  . Alcohol use: Yes    Comment: Socially   . Drug use: Not Currently  . Sexual activity: Yes    Birth control/protection: I.U.D.  Lifestyle  . Physical activity    Days per week: Not on file    Minutes per session: Not on file  . Stress: Not on file  Relationships  . Social Herbalist on phone: Not on file    Gets together: Not on file    Attends religious service: Not on file    Active member of club or organization: Not on file    Attends meetings of clubs or organizations: Not on file    Relationship status: Not on file  . Intimate partner violence    Fear of current or ex partner: Not on file    Emotionally abused: Not on file    Physically abused: Not on file    Forced sexual activity: Not on file  Other Topics Concern  . Not on  file  Social History Narrative  . Not on file    Review of Systems  Constitutional: Negative.   HENT: Negative.   Eyes: Negative.   Respiratory: Negative.   Cardiovascular: Negative.   Gastrointestinal: Negative.   Genitourinary: Negative.   Musculoskeletal: Negative.   Skin: Negative.   Neurological: Negative.   Endo/Heme/Allergies: Negative.   Psychiatric/Behavioral: Negative.     PHYSICAL EXAMINATION:    BP 124/80 (BP Location: Right Arm, Patient Position: Sitting, Cuff Size: Normal)   Pulse 72   Temp (!) 97.1 F (36.2 C) (Skin)   Wt 214 lb 6.4 oz (97.3 kg)   BMI 34.61 kg/m     General appearance: alert, cooperative and appears stated age  Pelvic: External genitalia:  no lesions              Urethra:  normal appearing urethra with no masses, tenderness or lesions              Bartholins and Skenes: normal                 Vagina: normal appearing vagina with normal color and discharge, no lesions              Cervix: no lesions and IUD string 3 cm              Bimanual Exam:  Uterus:  normal size, contour, position, consistency, mobility, non-tender              Adnexa: no mass,  fullness, tenderness               Chaperone was present for exam.  ASSESSMENT IUD check, doing well    PLAN Routine f/u   An After Visit Summary was printed and given to the patient.

## 2019-03-07 ENCOUNTER — Ambulatory Visit: Payer: BC Managed Care – PPO | Admitting: Obstetrics and Gynecology

## 2019-03-07 ENCOUNTER — Encounter: Payer: Self-pay | Admitting: Obstetrics and Gynecology

## 2019-03-07 ENCOUNTER — Other Ambulatory Visit: Payer: Self-pay | Admitting: Obstetrics and Gynecology

## 2019-03-07 VITALS — BP 124/80 | HR 72 | Temp 97.1°F | Wt 214.4 lb

## 2019-03-07 DIAGNOSIS — Z30431 Encounter for routine checking of intrauterine contraceptive device: Secondary | ICD-10-CM | POA: Diagnosis not present

## 2019-03-07 DIAGNOSIS — R928 Other abnormal and inconclusive findings on diagnostic imaging of breast: Secondary | ICD-10-CM

## 2019-03-11 ENCOUNTER — Other Ambulatory Visit: Payer: Self-pay | Admitting: Obstetrics and Gynecology

## 2019-03-11 ENCOUNTER — Ambulatory Visit
Admission: RE | Admit: 2019-03-11 | Discharge: 2019-03-11 | Disposition: A | Discharge status: Home / Self Care | Payer: BC Managed Care – PPO | Source: Ambulatory Visit | Attending: Obstetrics and Gynecology | Admitting: Obstetrics and Gynecology

## 2019-03-11 ENCOUNTER — Other Ambulatory Visit: Payer: Self-pay

## 2019-03-11 DIAGNOSIS — N6320 Unspecified lump in the left breast, unspecified quadrant: Secondary | ICD-10-CM

## 2019-03-11 DIAGNOSIS — R928 Other abnormal and inconclusive findings on diagnostic imaging of breast: Secondary | ICD-10-CM

## 2019-03-12 ENCOUNTER — Other Ambulatory Visit: Payer: Self-pay | Admitting: Obstetrics and Gynecology

## 2019-03-12 ENCOUNTER — Ambulatory Visit
Admission: RE | Admit: 2019-03-12 | Discharge: 2019-03-12 | Disposition: A | Discharge status: Home / Self Care | Payer: BC Managed Care – PPO | Source: Ambulatory Visit | Attending: Obstetrics and Gynecology | Admitting: Obstetrics and Gynecology

## 2019-03-12 DIAGNOSIS — N6320 Unspecified lump in the left breast, unspecified quadrant: Secondary | ICD-10-CM

## 2019-03-13 ENCOUNTER — Encounter: Payer: Self-pay | Admitting: Obstetrics and Gynecology

## 2019-03-14 ENCOUNTER — Telehealth: Payer: Self-pay | Admitting: Obstetrics and Gynecology

## 2019-03-14 NOTE — Telephone Encounter (Signed)
[facility]  Phone Number: [phone]  Hi Dr. Talbert Nan -   Thank you for the note and results. I wanted to let you know what is absent from this information. That is, the reason for the second biopsy. I was supposed to be put thru only one. At my appt with Dr Theda Sers I was shown two areas and only 1 that was of concern and needed biopsy. I went thru my biopsy Tuesday and after the final mammogram was told by the Dr that they biopsied the wrong mass and I would need to come back for another procedure. To add to this, I was also told I would be financially responsible for this error because they would've wanted to biopsy this anyways. This was not my understanding and I am now waiting to hear more from the Glass blower/designer. As you can imagine, this has been very stressful.  Best,  Tanya Raymond

## 2019-03-18 NOTE — Telephone Encounter (Signed)
Provider notified.   Encounter closed.

## 2019-03-19 ENCOUNTER — Other Ambulatory Visit: Payer: Self-pay | Admitting: Obstetrics and Gynecology

## 2019-03-19 ENCOUNTER — Other Ambulatory Visit: Payer: Self-pay

## 2019-03-19 ENCOUNTER — Ambulatory Visit
Admission: RE | Admit: 2019-03-19 | Discharge: 2019-03-19 | Disposition: A | Discharge status: Home / Self Care | Payer: BC Managed Care – PPO | Source: Ambulatory Visit | Attending: Obstetrics and Gynecology | Admitting: Obstetrics and Gynecology

## 2019-03-19 DIAGNOSIS — N6320 Unspecified lump in the left breast, unspecified quadrant: Secondary | ICD-10-CM

## 2019-03-19 HISTORY — PX: BREAST BIOPSY: SHX20

## 2019-03-25 ENCOUNTER — Other Ambulatory Visit: Payer: Self-pay | Admitting: Family

## 2019-03-25 ENCOUNTER — Encounter: Payer: Self-pay | Admitting: Family

## 2019-03-25 MED ORDER — ESCITALOPRAM OXALATE 5 MG PO TABS: 5.0000 mg | ORAL_TABLET | Freq: Every day | ORAL | 1 refills | Status: DC

## 2019-04-16 ENCOUNTER — Other Ambulatory Visit: Payer: Self-pay | Admitting: Family

## 2019-06-08 ENCOUNTER — Ambulatory Visit: Payer: BC Managed Care – PPO | Attending: Internal Medicine

## 2019-06-08 DIAGNOSIS — Z23 Encounter for immunization: Secondary | ICD-10-CM

## 2019-06-08 NOTE — Progress Notes (Signed)
   Covid-19 Vaccination Clinic  Name:  Tanya Raymond    MRN: [id number] DOB: [birth year]  06/08/2019  Ms. Bean was observed post Covid-19 immunization for 15 minutes without incident. She was provided with Vaccine Information Sheet and instruction to access the V-Safe system.   [name] was instructed to call 911 with any severe reactions post vaccine: Marland Kitchen Difficulty breathing  . Swelling of face and throat  . A fast heartbeat  . A bad rash all over body  . Dizziness and weakness   Immunizations Administered    Name Date Dose VIS Date Route   Pfizer COVID-19 Vaccine 06/08/2019  1:08 PM 0.3 mL 03/15/2019 Intramuscular   Manufacturer: [employer], [employer]   Lot: [id number]   NDC: [address]-0

## 2019-06-11 ENCOUNTER — Encounter: Payer: Self-pay | Admitting: Family

## 2019-06-11 ENCOUNTER — Other Ambulatory Visit: Payer: Self-pay

## 2019-06-11 ENCOUNTER — Ambulatory Visit: Payer: BC Managed Care – PPO | Admitting: Family

## 2019-06-11 VITALS — BP 160/90 | HR 59 | Temp 98.4°F | Ht 66.0 in | Wt 203.1 lb

## 2019-06-11 DIAGNOSIS — F329 Major depressive disorder, single episode, unspecified: Secondary | ICD-10-CM

## 2019-06-11 DIAGNOSIS — F32A Depression, unspecified: Secondary | ICD-10-CM

## 2019-06-11 DIAGNOSIS — F419 Anxiety disorder, unspecified: Secondary | ICD-10-CM

## 2019-06-11 DIAGNOSIS — R7309 Other abnormal glucose: Secondary | ICD-10-CM

## 2019-06-11 DIAGNOSIS — E7800 Pure hypercholesterolemia, unspecified: Secondary | ICD-10-CM

## 2019-06-11 LAB — HEMOGLOBIN A1C: Hgb A1c MFr Bld: 5.4 % (ref 4.6–6.5)

## 2019-06-11 MED ORDER — ESCITALOPRAM OXALATE 5 MG PO TABS: 5.0000 mg | ORAL_TABLET | Freq: Every day | ORAL | 1 refills | Status: DC

## 2019-06-11 NOTE — Progress Notes (Signed)
  Tanya Raymond is a 44 y.o. female with the following history as recorded in EpicCare:  There are no problems to display for this patient.   Current Outpatient Medications  Medication Sig Dispense Refill  . escitalopram (LEXAPRO) 5 MG tablet Take 1 tablet (5 mg total) by mouth daily. 90 tablet 1  . levonorgestrel (MIRENA) 20 MCG/24HR IUD 1 each by Intrauterine route once.    Marland Kitchen MAGNESIUM PO Take by mouth.    . Multiple Vitamin (MULTIVITAMIN PO) Take by mouth.    Marland Kitchen VITAMIN D PO Take by mouth.     No current facility-administered medications for this visit.    Allergies: Patient has no known allergies.  Past Medical History:  Diagnosis Date  . Anxiety     History reviewed. No pertinent surgical history.  Family History  Problem Relation Age of Onset  . Hypertension Mother   . Hypertension Father   . Heart attack Maternal Grandfather   . Heart attack Paternal Grandmother   . Heart attack Paternal Grandfather     Social History   Tobacco Use  . Smoking status: Never Smoker  . Smokeless tobacco: Never Used  Substance Use Topics  . Alcohol use: Yes    Comment: Socially     Subjective:  4 month follow up on chronic care needs; has been working on weight loss- has lost 20 pounds since OV in 02/2019; doing very well on Lexapro 5 mg daily; Does have white coat hypertension- admits she is anxious in office today; does check at home regularly and always well controlled- averaging 124-130/ 80; Denies any chest pain, shortness of breath, blurred vision or headache   Objective:  Vitals:   06/11/19 0902  BP: (!) 160/90  Pulse: (!) 59  Temp: 98.4 F (36.9 C)  TempSrc: Oral  SpO2: 99%  Weight: 203 lb 2 oz (92.1 kg)  Height: 5\' 6"  (1.676 m)    General: Well developed, well nourished, in no acute distress  Skin : Warm and dry.  Head: Normocephalic and atraumatic  Lungs: Respirations unlabored; clear to auscultation bilaterally without wheeze, rales, rhonchi  CVS exam:  normal rate and regular rhythm.  Neurologic: Alert and oriented; speech intact; face symmetrical; moves all extremities well; CNII-XII intact without focal deficit   Assessment:  1. Elevated glucose   2. Anxiety and depression   3. Elevated cholesterol     Plan:  1. Suspect has normalized with weight loss; check hgba1c today; 2. Stable; refill on Lexapro 5 mg daily; 3. Plan to check again in 6 months- medication not indicated on ASCVD score; Continue working on weight loss goals;  This visit occurred during the SARS-CoV-2 public health emergency.  Safety protocols were in place, including screening questions prior to the visit, additional usage of staff PPE, and extensive cleaning of exam room while observing appropriate contact time as indicated for disinfecting solutions.      Return in about 6 months (around 12/12/2019).  Orders Placed This Encounter  Procedures  . HgB A1c    Requested Prescriptions   Signed Prescriptions Disp Refills  . escitalopram (LEXAPRO) 5 MG tablet 90 tablet 1    Sig: Take 1 tablet (5 mg total) by mouth daily.

## 2019-06-29 ENCOUNTER — Ambulatory Visit: Payer: BC Managed Care – PPO | Attending: Internal Medicine

## 2019-06-29 DIAGNOSIS — Z23 Encounter for immunization: Secondary | ICD-10-CM

## 2019-06-29 NOTE — Progress Notes (Signed)
   Covid-19 Vaccination Clinic  Name:  Tanya Raymond    MRN: [id number] DOB: 1975-09-05  06/29/2019  [name] was observed post Covid-19 immunization for 15 minutes without incident. She was provided with Vaccine Information Sheet and instruction to access the V-Safe system.   [name] was instructed to call 911 with any severe reactions post vaccine: Marland Kitchen Difficulty breathing  . Swelling of face and throat  . A fast heartbeat  . A bad rash all over body  . Dizziness and weakness   Immunizations Administered    Name Date Dose VIS Date Route   Pfizer COVID-19 Vaccine 06/29/2019  1:07 PM 0.3 mL 03/15/2019 Intramuscular   Manufacturer: [employer], [employer]   Lot: [id number]   NDC: [address]-1

## 2019-10-11 ENCOUNTER — Encounter: Payer: Self-pay | Admitting: Family

## 2019-12-13 ENCOUNTER — Ambulatory Visit: Payer: BC Managed Care – PPO | Admitting: Family

## 2019-12-22 ENCOUNTER — Other Ambulatory Visit: Payer: Self-pay | Admitting: Family

## 2020-01-31 ENCOUNTER — Other Ambulatory Visit: Payer: Self-pay

## 2020-01-31 ENCOUNTER — Ambulatory Visit (INDEPENDENT_AMBULATORY_CARE_PROVIDER_SITE_OTHER): Payer: BC Managed Care – PPO | Admitting: Family

## 2020-01-31 ENCOUNTER — Encounter: Payer: Self-pay | Admitting: Family

## 2020-01-31 VITALS — BP 152/86 | HR 72 | Temp 98.3°F | Ht 66.0 in | Wt 234.0 lb

## 2020-01-31 DIAGNOSIS — R7309 Other abnormal glucose: Secondary | ICD-10-CM | POA: Diagnosis not present

## 2020-01-31 DIAGNOSIS — E785 Hyperlipidemia, unspecified: Secondary | ICD-10-CM

## 2020-01-31 DIAGNOSIS — R030 Elevated blood-pressure reading, without diagnosis of hypertension: Secondary | ICD-10-CM | POA: Diagnosis not present

## 2020-01-31 DIAGNOSIS — Z23 Encounter for immunization: Secondary | ICD-10-CM

## 2020-01-31 NOTE — Progress Notes (Signed)
Tanya Raymond is a 44 y.o. female with the following history as recorded in EpicCare:  There are no problems to display for this patient.   Current Outpatient Medications  Medication Sig Dispense Refill  . escitalopram (LEXAPRO) 5 MG tablet TAKE 1 TABLET BY MOUTH EVERY DAY 90 tablet 0  . levonorgestrel (MIRENA) 20 MCG/24HR IUD 1 each by Intrauterine route once.    Marland Kitchen MAGNESIUM PO Take by mouth.    . Multiple Vitamin (MULTIVITAMIN PO) Take by mouth.    Marland Kitchen VITAMIN D PO Take by mouth.     No current facility-administered medications for this visit.    Allergies: Patient has no known allergies.  Past Medical History:  Diagnosis Date  . Anxiety     History reviewed. No pertinent surgical history.  Family History  Problem Relation Age of Onset  . Hypertension Mother   . Hypertension Father   . Heart attack Maternal Grandfather   . Heart attack Paternal Grandmother   . Heart attack Paternal Grandfather     Social History   Tobacco Use  . Smoking status: Never Smoker  . Smokeless tobacco: Never Used  Substance Use Topics  . Alcohol use: Yes    Comment: Socially     Subjective:  6 month follow-up on chronic care needs; unfortunately, work demands have increased tremendously and affected ability to meal plan; has gained her weight back; is still doing well on Lexapro at current dosage; is not checking her blood pressure regularly; prefers not to get labs checked today;   Okay for Tdap today;   Objective:  Vitals:   01/31/20 0837  BP: (!) 152/86  Pulse: 72  Temp: 98.3 F (36.8 C)  TempSrc: Oral  SpO2: 98%  Weight: 234 lb (106.1 kg)  Height: _0  (1.676 m)    General: Well developed, well nourished, in no acute distress  Head: Normocephalic and atraumatic  Eyes: Sclera and conjunctiva clear; pupils round and reactive to light; extraocular movements intact  Ears: External normal; canals clear; tympanic membranes normal  Oropharynx: Pink, supple. No suspicious  lesions  Neck: Supple without thyromegaly, adenopathy  Lungs: Respirations unlabored; clear to auscultation bilaterally without wheeze, rales, rhonchi  CVS exam: normal rate and regular rhythm.  Neurologic: Alert and oriented; speech intact; face symmetrical; moves all extremities well; CNII-XII intact without focal deficit   Assessment:  1. Elevated blood pressure reading   2. Elevated glucose   3. Hyperlipidemia, unspecified hyperlipidemia type   4. Need for Tdap vaccination     Plan:   Patient will start checking her blood pressure regularly and forward results for review in the next 2 weeks; may need to start medication;  She defers labs today- will make decision on labs and follow-up based on blood pressure readings;  Tdap updated;  This visit occurred during the SARS-CoV-2 public health emergency.  Safety protocols were in place, including screening questions prior to the visit, additional usage of staff PPE, and extensive cleaning of exam room while observing appropriate contact time as indicated for disinfecting solutions.     No follow-ups on file.  Orders Placed This Encounter  Procedures  . Tdap vaccine greater than or equal to 7yo IM  . Comp Met (CMET)    Standing Status:   Future    Standing Expiration Date:   01/30/2021  . Lipid panel    Standing Status:   Future    Standing Expiration Date:   01/30/2021  . Hemoglobin A1c  Standing Status:   Future    Standing Expiration Date:   01/30/2021    Requested Prescriptions    No prescriptions requested or ordered in this encounter

## 2020-02-09 ENCOUNTER — Encounter: Payer: Self-pay | Admitting: Family

## 2020-02-10 ENCOUNTER — Other Ambulatory Visit: Payer: Self-pay | Admitting: Family

## 2020-02-10 MED ORDER — LOSARTAN POTASSIUM 50 MG PO TABS: 50.0000 mg | ORAL_TABLET | Freq: Every day | ORAL | 0 refills | Status: DC

## 2020-03-17 ENCOUNTER — Other Ambulatory Visit: Payer: Self-pay | Admitting: Family

## 2020-04-09 ENCOUNTER — Encounter: Payer: Self-pay | Admitting: Family

## 2020-04-10 ENCOUNTER — Other Ambulatory Visit: Payer: Self-pay | Admitting: Family

## 2020-04-10 MED ORDER — LOSARTAN POTASSIUM 100 MG PO TABS: 100.0000 mg | ORAL_TABLET | Freq: Every day | ORAL | 1 refills | Status: DC

## 2020-05-04 ENCOUNTER — Other Ambulatory Visit: Payer: Self-pay | Admitting: Family

## 2020-05-11 ENCOUNTER — Other Ambulatory Visit: Payer: Self-pay | Admitting: Obstetrics and Gynecology

## 2020-05-11 DIAGNOSIS — Z1231 Encounter for screening mammogram for malignant neoplasm of breast: Secondary | ICD-10-CM

## 2020-06-09 ENCOUNTER — Ambulatory Visit
Admission: RE | Admit: 2020-06-09 | Discharge: 2020-06-09 | Disposition: A | Discharge status: Home / Self Care | Payer: BC Managed Care – PPO | Source: Ambulatory Visit

## 2020-06-09 ENCOUNTER — Other Ambulatory Visit: Payer: Self-pay

## 2020-06-09 DIAGNOSIS — Z1231 Encounter for screening mammogram for malignant neoplasm of breast: Secondary | ICD-10-CM

## 2020-06-12 ENCOUNTER — Other Ambulatory Visit: Payer: Self-pay | Admitting: Family

## 2020-06-15 ENCOUNTER — Other Ambulatory Visit: Payer: Self-pay | Admitting: Obstetrics and Gynecology

## 2020-06-15 DIAGNOSIS — R928 Other abnormal and inconclusive findings on diagnostic imaging of breast: Secondary | ICD-10-CM

## 2020-06-29 ENCOUNTER — Other Ambulatory Visit: Payer: Self-pay

## 2020-06-29 ENCOUNTER — Other Ambulatory Visit (HOSPITAL_COMMUNITY)
Admission: RE | Admit: 2020-06-29 | Discharge: 2020-06-29 | Disposition: A | Discharge status: Home / Self Care | Payer: BC Managed Care – PPO | Source: Ambulatory Visit | Attending: Obstetrics and Gynecology | Admitting: Obstetrics and Gynecology

## 2020-06-29 ENCOUNTER — Ambulatory Visit: Payer: BC Managed Care – PPO | Admitting: Obstetrics and Gynecology

## 2020-06-29 ENCOUNTER — Encounter: Payer: Self-pay | Admitting: Obstetrics and Gynecology

## 2020-06-29 VITALS — BP 140/82 | HR 98 | Ht 66.0 in | Wt 246.0 lb

## 2020-06-29 DIAGNOSIS — Z01419 Encounter for gynecological examination (general) (routine) without abnormal findings: Secondary | ICD-10-CM | POA: Diagnosis not present

## 2020-06-29 DIAGNOSIS — Z7185 Encounter for immunization safety counseling: Secondary | ICD-10-CM

## 2020-06-29 DIAGNOSIS — Z124 Encounter for screening for malignant neoplasm of cervix: Secondary | ICD-10-CM | POA: Diagnosis not present

## 2020-06-29 DIAGNOSIS — Z23 Encounter for immunization: Secondary | ICD-10-CM | POA: Diagnosis not present

## 2020-06-29 NOTE — Patient Instructions (Signed)

## 2020-06-29 NOTE — Progress Notes (Signed)
45 y.o. G37P1001 Married White or Caucasian Not Hispanic or Latino female here for annual exam.  Has a mirena IUD, inserted in 10/20. Not having cycles.  Sexually active, no pain. No bowel or bladder c/o.   She has been started on medication for BP.     No LMP recorded. (Menstrual status: IUD).          Sexually active: Yes.    The current method of family planning is IUD.    Exercising: Yes.    walking and gardening  Smoker:  no  Health Maintenance: Pap:  12/29/17 WNL, no hpv testing History of abnormal Pap:  no MMG:  06/12/20 density B Bi-rads Needs additional imaging. Scheduled for 07/01/20 BMD:   None  Colonoscopy: never  TDaP:  01/31/20  Gardasil:  None    reports that she has never smoked. She has never used smokeless tobacco. She reports current alcohol use. She reports previous drug use. 1-2 beers a weekShe works in Advertising account executive, [employer]. Daughter is 9. Husband is a Professor  Past Medical History:  Diagnosis Date  . Anxiety     Past Surgical History:  Procedure Laterality Date  . BREAST BIOPSY Left 03/19/2019    FIBROCYSTIC CHANGES WITH CALCIFICATIONS     Current Outpatient Medications  Medication Sig Dispense Refill  . escitalopram (LEXAPRO) 5 MG tablet TAKE 1 TABLET BY MOUTH EVERY DAY 90 tablet 0  . levonorgestrel (MIRENA) 20 MCG/24HR IUD 1 each by Intrauterine route once.    Marland Kitchen losartan (COZAAR) 100 MG tablet Take 1 tablet (100 mg total) by mouth daily. 90 tablet 1  . MAGNESIUM PO Take by mouth.    . Multiple Vitamin (MULTIVITAMIN PO) Take by mouth.    Marland Kitchen VITAMIN D PO Take by mouth.     No current facility-administered medications for this visit.    Family History  Problem Relation Age of Onset  . Hypertension Mother   . Hypertension Father   . Heart attack Maternal Grandfather   . Heart attack Paternal Grandmother   . Heart attack Paternal Grandfather     Review of Systems  Psychiatric/Behavioral: The patient is nervous/anxious.   All other systems  reviewed and are negative.   Exam:   BP 140/82   Pulse 98   Ht 5\' 6"  (1.676 m)   Wt 246 lb (111.6 kg)   SpO2 99%   BMI 39.71 kg/m   Weight change: @WEIGHTCHANGE @ Height:   Height: 5\' 6"  (167.6 cm)  Ht Readings from Last 3 Encounters:  06/29/20 5\' 6"  (1.676 m)  01/31/20 5\' 6"  (1.676 m)  06/11/19 5\' 6"  (1.676 m)    General appearance: alert, cooperative and appears stated age Head: Normocephalic, without obvious abnormality, atraumatic Neck: no adenopathy, supple, symmetrical, trachea midline and thyroid normal to inspection and palpation Lungs: clear to auscultation bilaterally Cardiovascular: regular rate and rhythm Breasts: normal appearance, no masses or tenderness Abdomen: soft, non-tender; non distended,  no masses,  no organomegaly Extremities: extremities normal, atraumatic, no cyanosis or edema Skin: Skin color, texture, turgor normal. No rashes or lesions Lymph nodes: Cervical, supraclavicular, and axillary nodes normal. No abnormal inguinal nodes palpated Neurologic: Grossly normal   Pelvic: External genitalia:  no lesions              Urethra:  normal appearing urethra with no masses, tenderness or lesions              Bartholins and Skenes: normal  Vagina: normal appearing vagina with normal color and discharge, no lesions              Cervix: no lesions and IUD string 2-3 cm. Friable with pap               Bimanual Exam:  Uterus:  no masses or tenderness              Adnexa: no mass, fullness, tenderness               Rectovaginal: Confirms               Anus:  normal sphincter tone, no lesions  Carolynn Serve chaperoned for the exam.  1. Well woman exam Information given on gardasil, will start series Discussed breast self exam Discussed calcium and vit D intake Mammogram f/u scheduled   2. Screening for cervical cancer - Cytology - PAP with hpv  3. Immunization counseling - HPV 9-valent vaccine,Recombinat

## 2020-06-30 LAB — CYTOLOGY - PAP
Comment: NEGATIVE
Diagnosis: NEGATIVE
High risk HPV: NEGATIVE

## 2020-07-01 ENCOUNTER — Ambulatory Visit: Payer: BC Managed Care – PPO

## 2020-07-01 ENCOUNTER — Other Ambulatory Visit: Payer: Self-pay | Admitting: Obstetrics and Gynecology

## 2020-07-01 ENCOUNTER — Other Ambulatory Visit: Payer: Self-pay

## 2020-07-01 ENCOUNTER — Ambulatory Visit
Admission: RE | Admit: 2020-07-01 | Discharge: 2020-07-01 | Disposition: A | Discharge status: Home / Self Care | Payer: BC Managed Care – PPO | Source: Ambulatory Visit | Attending: Obstetrics and Gynecology | Admitting: Obstetrics and Gynecology

## 2020-07-01 DIAGNOSIS — R928 Other abnormal and inconclusive findings on diagnostic imaging of breast: Secondary | ICD-10-CM

## 2020-07-03 ENCOUNTER — Other Ambulatory Visit: Payer: Self-pay

## 2020-07-03 ENCOUNTER — Ambulatory Visit
Admission: RE | Admit: 2020-07-03 | Discharge: 2020-07-03 | Disposition: A | Discharge status: Home / Self Care | Payer: BC Managed Care – PPO | Source: Ambulatory Visit | Attending: Obstetrics and Gynecology | Admitting: Obstetrics and Gynecology

## 2020-07-03 ENCOUNTER — Other Ambulatory Visit: Payer: Self-pay | Admitting: Obstetrics and Gynecology

## 2020-07-03 DIAGNOSIS — R928 Other abnormal and inconclusive findings on diagnostic imaging of breast: Secondary | ICD-10-CM

## 2020-07-07 ENCOUNTER — Other Ambulatory Visit: Payer: Self-pay | Admitting: Obstetrics and Gynecology

## 2020-07-07 DIAGNOSIS — R921 Mammographic calcification found on diagnostic imaging of breast: Secondary | ICD-10-CM

## 2020-07-07 DIAGNOSIS — N6091 Unspecified benign mammary dysplasia of right breast: Secondary | ICD-10-CM

## 2020-07-15 ENCOUNTER — Ambulatory Visit
Admission: RE | Admit: 2020-07-15 | Discharge: 2020-07-15 | Disposition: A | Discharge status: Home / Self Care | Payer: BC Managed Care – PPO | Source: Ambulatory Visit | Attending: Obstetrics and Gynecology | Admitting: Obstetrics and Gynecology

## 2020-07-15 ENCOUNTER — Other Ambulatory Visit: Payer: Self-pay

## 2020-07-15 DIAGNOSIS — R921 Mammographic calcification found on diagnostic imaging of breast: Secondary | ICD-10-CM

## 2020-07-15 DIAGNOSIS — N6091 Unspecified benign mammary dysplasia of right breast: Secondary | ICD-10-CM

## 2020-08-19 ENCOUNTER — Ambulatory Visit: Payer: Self-pay | Admitting: General Surgery

## 2020-08-19 DIAGNOSIS — N6092 Unspecified benign mammary dysplasia of left breast: Secondary | ICD-10-CM

## 2020-08-20 ENCOUNTER — Inpatient Hospital Stay: Payer: BC Managed Care – PPO | Admitting: Hematology and Oncology

## 2020-08-20 ENCOUNTER — Inpatient Hospital Stay: Payer: BC Managed Care – PPO

## 2020-08-20 ENCOUNTER — Inpatient Hospital Stay: Payer: BC Managed Care – PPO | Admitting: Genetic Counselor

## 2020-08-21 ENCOUNTER — Other Ambulatory Visit: Payer: Self-pay | Admitting: General Surgery

## 2020-08-21 DIAGNOSIS — N6092 Unspecified benign mammary dysplasia of left breast: Secondary | ICD-10-CM

## 2020-08-21 DIAGNOSIS — N6091 Unspecified benign mammary dysplasia of right breast: Secondary | ICD-10-CM

## 2020-09-07 ENCOUNTER — Other Ambulatory Visit: Payer: Self-pay | Admitting: Family

## 2020-09-10 ENCOUNTER — Encounter: Payer: Self-pay | Admitting: Hematology and Oncology

## 2020-09-10 ENCOUNTER — Inpatient Hospital Stay (HOSPITAL_BASED_OUTPATIENT_CLINIC_OR_DEPARTMENT_OTHER): Payer: BC Managed Care – PPO | Admitting: Genetic Counselor

## 2020-09-10 ENCOUNTER — Encounter: Payer: Self-pay | Admitting: Genetic Counselor

## 2020-09-10 ENCOUNTER — Inpatient Hospital Stay: Payer: BC Managed Care – PPO

## 2020-09-10 ENCOUNTER — Other Ambulatory Visit: Payer: Self-pay | Admitting: Genetic Counselor

## 2020-09-10 ENCOUNTER — Inpatient Hospital Stay: Payer: BC Managed Care – PPO | Attending: Hematology and Oncology | Admitting: Hematology and Oncology

## 2020-09-10 ENCOUNTER — Other Ambulatory Visit: Payer: Self-pay

## 2020-09-10 VITALS — BP 162/106 | HR 88 | Temp 99.4°F | Resp 18 | Ht 66.0 in | Wt 248.2 lb

## 2020-09-10 DIAGNOSIS — I10 Essential (primary) hypertension: Secondary | ICD-10-CM | POA: Diagnosis not present

## 2020-09-10 DIAGNOSIS — Z803 Family history of malignant neoplasm of breast: Secondary | ICD-10-CM

## 2020-09-10 DIAGNOSIS — Z8041 Family history of malignant neoplasm of ovary: Secondary | ICD-10-CM

## 2020-09-10 DIAGNOSIS — Z8042 Family history of malignant neoplasm of prostate: Secondary | ICD-10-CM

## 2020-09-10 DIAGNOSIS — N6091 Unspecified benign mammary dysplasia of right breast: Secondary | ICD-10-CM | POA: Diagnosis not present

## 2020-09-10 DIAGNOSIS — Z9189 Other specified personal risk factors, not elsewhere classified: Secondary | ICD-10-CM

## 2020-09-10 DIAGNOSIS — N6099 Unspecified benign mammary dysplasia of unspecified breast: Secondary | ICD-10-CM

## 2020-09-10 DIAGNOSIS — Z800 Family history of malignant neoplasm of digestive organs: Secondary | ICD-10-CM

## 2020-09-10 HISTORY — DX: Family history of malignant neoplasm of ovary: Z80.41

## 2020-09-10 HISTORY — DX: Family history of malignant neoplasm of breast: Z80.3

## 2020-09-10 HISTORY — DX: Family history of malignant neoplasm of digestive organs: Z80.0

## 2020-09-10 HISTORY — DX: Family history of malignant neoplasm of prostate: Z80.42

## 2020-09-10 LAB — GENETIC SCREENING ORDER

## 2020-09-10 NOTE — Progress Notes (Signed)
REFERRING PROVIDER: Jovita Kussmaul, MD [address] [place],  [place] [address]  PRIMARY PROVIDER:  Marrian Salvage, FNP  PRIMARY REASON FOR VISIT:  1. Family history of breast cancer   2. FH: ovarian cancer   3. Family history of prostate cancer   4. Family history of colon cancer      HISTORY OF PRESENT ILLNESS:   Tanya Raymond, a 45 y.o. female, was seen for a [facility] cancer genetics consultation at the request of Dr. Marlou Starks due to a family history of cancer.  Tanya Raymond presents to clinic today to discuss the possibility of a hereditary predisposition to cancer, to discuss genetic testing, and to further clarify her future cancer risks, as well as potential cancer risks for family members.   Tanya Raymond is a 45 y.o. female with no personal history of cancer.  She was diagnosed with bilateral atypical ductal hyperplasia and is scheduled for bilateral lumpectomy on October 28, 2020.  She is being followed by Dr. Chryl Heck in the high risk clinic.   CANCER HISTORY:  Oncology History   No history exists.    RISK FACTORS:  Menarche was at age 88.  First live birth at age 89.  OCP use for approximately  15  years.  Ovaries intact: yes.  Hysterectomy: no.  Menopausal status: premenopausal.  HRT use: 0 years. Colonoscopy: no; not examined. Mammogram within the last year: yes. Number of breast biopsies: 5. Up to date with pelvic exams: yes. Any excessive radiation exposure in the past: no  Past Medical History:  Diagnosis Date   Anxiety    Family history of breast cancer 09/10/2020   Family history of colon cancer 09/10/2020   Family history of prostate cancer 09/10/2020   FH: ovarian cancer 09/10/2020    Past Surgical History:  Procedure Laterality Date   BREAST BIOPSY Left 03/19/2019    FIBROCYSTIC CHANGES WITH CALCIFICATIONS     Social History   Socioeconomic History   Marital status: Married    Spouse name: Not on file   Number of children: Not on file    Years of education: Not on file   Highest education level: Not on file  Occupational History   Not on file  Tobacco Use   Smoking status: Never   Smokeless tobacco: Never  Vaping Use   Vaping Use: Never used  Substance and Sexual Activity   Alcohol use: Yes    Comment: Socially    Drug use: Not Currently   Sexual activity: Yes    Birth control/protection: I.U.D.  Other Topics Concern   Not on file  Social History Narrative   Not on file   Social Determinants of Health   Financial Resource Strain: Not on file  Food Insecurity: Not on file  Transportation Needs: Not on file  Physical Activity: Not on file  Stress: Not on file  Social Connections: Not on file     FAMILY HISTORY:  We obtained a detailed, 4-generation family history.  Significant diagnoses are listed below: Family History  Problem Relation Age of Onset   Breast cancer Paternal Aunt 32   Prostate cancer Paternal Uncle 40       metastatic   Breast cancer Maternal Grandmother 43   Cancer Paternal Grandmother 45       unknown type; metastatic; ?colon   Ovarian cancer Cousin 38       maternal cousin    Tanya Raymond has one daughter, age 101.  She has one brother, age 1, and one sister, age 49, all without a history of cancer.  Tanya Raymond is living at age 32 and has not had cancer.  Tanya Raymond maternal grandmother had breast cancer diagnosed at age 9 and passed away at age 34.  Tanya Raymond father is living at age 21.  Tanya Raymond paternal aunt had breast cancer, diagnosed at age 18.  Her maternal cousin had ovarian cancer diagnosed at age 69.  Ms. Deike paternal uncle has metastatic prostate cancer, first diagnosed at age 32.  Ms. Magallanes paternal grandmother had an unknown type of metastatic cancer, possibly colon, at the age of 45.    Tanya Raymond is unaware of previous family history of genetic testing for hereditary cancer risks. Other family members are not available for  testing at this point in time. Patient's maternal ancestors are of [name] and [place] descent, and paternal ancestors are of [place] descent. There is no reported Ashkenazi Jewish ancestry. There is no known consanguinity.  GENETIC COUNSELING ASSESSMENT: Tanya Raymond is a 45 y.o. female with a family history of cancer which is somewhat suggestive of a hereditary cancer syndrome and predisposition to cancer given the ages of diagnosis and presence of related cancers in her paternal family. We, therefore, discussed and recommended the following at today's visit.   DISCUSSION: We discussed that 5 - 10% of cancer is hereditary, with most cases of hereditary breast cancer associated with mutations in BRCA1/2.  There are other genes that can be associated with hereditary breast cancer syndromes.  These include but are not limited to CHEK2, ATM, and PALB2.  We discussed that testing is beneficial for several reasons, including knowing about other cancer risks, identifying potential screening and risk-reduction options that may be appropriate, and to understanding if other family members could be at risk for cancer and allowing them to undergo genetic testing.  We reviewed the characteristics, features and inheritance patterns of hereditary cancer syndromes. We also discussed genetic testing, including the appropriate family members to test, the process of testing, insurance coverage and turn-around-time for results. We discussed the implications of a negative, positive and/or variant of uncertain significant result. In order to get genetic test results in a timely manner so that Tanya Raymond can use these genetic test results for surgical decisions, we recommended Tanya Raymond pursue genetic testing for the [facility] BRCAPlus STAT Panel.  The BRCAplus panel offered by [facility] and includes sequencing and deletion/duplication analysis for the following 8 genes: ATM, BRCA1, BRCA2, CDH1, CHEK2, PALB2, PTEN, and  TP53.  Once complete, we recommend Tanya Raymond pursue reflex genetic testing to a more gene panel that contains genes associated with ovarian, prostate, and other cancers.   Tanya Raymond was offered a common hereditary cancer panel (47 genes) and an expanded pan-cancer panel (77 genes). Tanya Raymond was informed of the benefits and limitations of each panel, including that expanded pan-cancer panels contain several genes that do not have clear management guidelines at this point in time.  We also discussed that as the number of genes included on a panel increases, the chances of variants of uncertain significance increases.  After considering the benefits and limitations of each gene panel, Tanya Raymond elected to have an expanded pan-cancer panel through [facility].  The CancerNext-Expanded gene panel offered by [facility] and includes sequencing, rearrangement, and RNA analysis for the following 77 genes: AIP, ALK, APC, ATM, AXIN2, BAP1, BARD1, BLM, BMPR1A, BRCA1, BRCA2, BRIP1, CDC73, CDH1, CDK4, CDKN1B, CDKN2A,  CHEK2, CTNNA1, DICER1, FANCC, FH, FLCN, GALNT12, KIF1B, LZTR1, MAX, MEN1, MET, MLH1, MSH2, MSH3, MSH6, MUTYH, NBN, NF1, NF2, NTHL1, PALB2, PHOX2B, PMS2, POT1, PRKAR1A, PTCH1, PTEN, RAD51C, RAD51D, RB1, RECQL, RET, SDHA, SDHAF2, SDHB, SDHC, SDHD, SMAD4, SMARCA4, SMARCB1, SMARCE1, STK11, SUFU, TMEM127, TP53, TSC1, TSC2, VHL and XRCC2 (sequencing and deletion/duplication); EGFR, EGLN1, HOXB13, KIT, MITF, PDGFRA, POLD1, and POLE (sequencing only); EPCAM and GREM1 (deletion/duplication only).    Based on Ms. Goodgame's family history of cancer, she meets medical criteria for genetic testing. Despite that she meets criteria, she may still have an out of pocket cost. We discussed that if her out of pocket cost for testing is over $100, the laboratory should contact her to discuss self-pay options and/or patient pay assistance programs.   We discussed that some people do not want to undergo genetic  testing due to fear of genetic discrimination.  A federal law called the Genetic Information Non-Discrimination Act (GINA) of 2008 helps protect individuals against genetic discrimination based on their genetic test results.  It impacts both health insurance and employment.  With health insurance, it protects against increased premiums, being kicked off insurance or being forced to take a test in order to be insured.  For employment it protects against hiring, firing and promoting decisions based on genetic test results.  GINA does not apply to those in the [employer], those who work for companies with less than 15 employees, and new life insurance or long-term disability insurance policies.  Health status due to a cancer diagnosis is not protected under GINA.  PLAN: After considering the risks, benefits, and limitations, Ms. Christley provided informed consent to pursue genetic testing and the blood sample was sent to [facility] for analysis of the [place] [facility]. Results should be available within approximately 1-2 weeks' time, at which point they will be disclosed by telephone to Ms. Casillas, as will any additional recommendations warranted by these results. Ms. Torain will receive a summary of her genetic counseling visit and a copy of her results once available. This information will also be available in Epic.   Lastly, we encouraged Ms. Schobert to remain in contact with cancer genetics annually so that we can continuously update the family history and inform her of any changes in cancer genetics and testing that may be of benefit for this family.   Ms. Narasimhan questions were answered to her satisfaction today. Our contact information was provided should additional questions or concerns arise. Thank you for the referral and allowing [name] to share in the care of your patient.   Jaivion Kingsley M. Joette Catching, [place], [facility] Genetic Counselor [email or web address] (P)  [phone]   The patient was seen for a total of 25 minutes in face-to-face genetic counseling. The patient was seen alone.  Drs. Magrinat, Lindi Adie and/or Burr Medico were available to discuss this case as needed.  _______________________________________________________________________ For Office Staff:  Number of people involved in session: 1 Was an Intern/ student involved with case: no

## 2020-09-10 NOTE — Progress Notes (Signed)
[facility] CONSULT NOTE  Patient Care Team: Olive Bass, FNP as PCP - General (Internal Medicine)  CHIEF COMPLAINTS/PURPOSE OF CONSULTATION:  Atypical ductal hyperplasia.  ASSESSMENT & PLAN:   No problem-specific Assessment & Plan notes found for this encounter.  No orders of the defined types were placed in this encounter.  Pathology review: I discussed the difference between atypical ductal hyperplasia, DCIS and invasive breast cancer. I explained to her that atypical ductal hyperplasia  is characterized by a proliferation of uniform epithelial cells filling part, but not the entirety, of the involved duct. ADH is associated with an increased risk of both ipsilateral and contralateral breast cancer and thus provides evidence of underlying breast abnormalities that predispose to breast cancer.   I discussed the risks and benefits of tamoxifen therapy. I believe that the risks outweigh benefits from tamoxifen. We have discussed mechanism of action, adverse effects from tamoxifen including but not limited to post menopausal symptoms, endometrial hyperplasia, DVT/PE, increased risk of cardiovascular events. There is no expected survival benefit from tamoxifen. It has however shown to decrease risk of ER positive breast cancer in bilateral breasts.  We also discussed about life style modifications as below  1. Exercise 30 minutes daily or 150 minutes a week. 2. Weight loss 3. Increasing fruits and vegetables and less red meat I believe all of the above would extensively decrease her risk of breast cancer as well as other cancers including cancers of the colon substantially.  If she were to elect bilateral mastectomy which she is debating although she most likely will proceed with lumpectomy, then she doesn't need any endocrine prevention  She will RTC with [name] in mid August after breast surgery.   HISTORY OF PRESENTING ILLNESS:  Tanya Raymond 45 y.o.  female is here because of ADH   Tanya Raymond is a very pleasant 45 yr old female patient with PMH significant for HTN, depression referred to hematology for abnormal mammogram.  Screening mammogram 06/09/2020    IMPRESSION: Further evaluation is suggested for possible mass with calcifications in the right breast.   Further evaluation is suggested for possible mass with calcifications in the left breast.  07/01/20  Diagnostic mammogram : Indeterminate right breast calcifications forming 2-3 distinct groups in the upper outer quadrant. Recommendation is for stereotactic biopsy of the largest group of calcifications. If these demonstrate benign pathology, additional six-month follow-up is recommended for the additional calcifications. 2. Probably benign left breast calcifications. If above pathology demonstrates benign findings, additional six-month follow-up is recommended.  07/03/2020  Right breast needle core biopsy showed ADH  07/15/2020  Other biopsies showed atypical apocrine hyperplasia and fibrocystic changes.  She denies any complaints except for situational anxiety to having to come to the Cancer center. She is scheduled for lumpectomy in July by Dr Carolynne Edouard. FH of breast cancer in paternal aunt in 20's and this aunt's daughter had ovarian cancer. Rest of the pertinent 10 point ROS reviewed and negative.  MEDICAL HISTORY:  Past Medical History:  Diagnosis Date   Anxiety     SURGICAL HISTORY: Past Surgical History:  Procedure Laterality Date   BREAST BIOPSY Left 03/19/2019    FIBROCYSTIC CHANGES WITH CALCIFICATIONS     SOCIAL HISTORY: Social History   Socioeconomic History   Marital status: Married    Spouse name: Not on file   Number of children: Not on file   Years of education: Not on file   Highest education level: Not on file  Occupational  History   Not on file  Tobacco Use   Smoking status: Never   Smokeless tobacco: Never  Vaping Use   Vaping Use:  Never used  Substance and Sexual Activity   Alcohol use: Yes    Comment: Socially    Drug use: Not Currently   Sexual activity: Yes    Birth control/protection: I.U.D.  Other Topics Concern   Not on file  Social History Narrative   Not on file   Social Determinants of Health   Financial Resource Strain: Not on file  Food Insecurity: Not on file  Transportation Needs: Not on file  Physical Activity: Not on file  Stress: Not on file  Social Connections: Not on file  Intimate Partner Violence: Not on file    FAMILY HISTORY: Family History  Problem Relation Age of Onset   Hypertension Mother    Hypertension Father    Heart attack Maternal Grandfather    Heart attack Paternal Grandmother    Heart attack Paternal Grandfather     ALLERGIES:  has No Known Allergies.  MEDICATIONS:  Current Outpatient Medications  Medication Sig Dispense Refill   escitalopram (LEXAPRO) 5 MG tablet TAKE 1 TABLET BY MOUTH EVERY DAY 90 tablet 0   levonorgestrel (MIRENA) 20 MCG/24HR IUD 1 each by Intrauterine route once.     losartan (COZAAR) 100 MG tablet Take 1 tablet (100 mg total) by mouth daily. 90 tablet 1   MAGNESIUM PO Take by mouth.     Multiple Vitamin (MULTIVITAMIN PO) Take by mouth.     VITAMIN D PO Take by mouth.     No current facility-administered medications for this visit.     PHYSICAL EXAMINATION:  ECOG PERFORMANCE STATUS: 0 - Asymptomatic  Vitals:   09/10/20 1001 09/10/20 1002  BP: (!) 159/114 (!) 162/106  Pulse: 88   Resp: 18   Temp: 99.4 F (37.4 C)   SpO2: 98%    Filed Weights   09/10/20 1001  Weight: 248 lb 3.2 oz (112.6 kg)    PE deferred today, mainly counseling visit.  LABORATORY DATA:  I have reviewed the data as listed Lab Results  Component Value Date   WBC 4.1 02/11/2019   HGB 13.3 02/11/2019   HCT 40.1 02/11/2019   MCV 92.1 02/11/2019   PLT 213.0 02/11/2019     Chemistry      Component Value Date/Time   NA 141 02/11/2019 0954   K 3.8  02/11/2019 0954   CL 106 02/11/2019 0954   CO2 26 02/11/2019 0954   BUN 11 02/11/2019 0954   CREATININE 0.82 02/11/2019 0954      Component Value Date/Time   CALCIUM 9.1 02/11/2019 0954   ALKPHOS 83 02/11/2019 0954   AST 14 02/11/2019 0954   ALT 13 02/11/2019 0954   BILITOT 0.3 02/11/2019 0954       RADIOGRAPHIC STUDIES: I have personally reviewed the radiological images as listed and agreed with the findings in the report. No results found.  All questions were answered. The patient knows to call the clinic with any problems, questions or concerns. I spent 45 minutes in the care of this patient including H and P, review of records, counseling and coordination of care. We discussed about ADH, role of surgery, endocrine prevention and surveillance recommendations. We discussed about mechanism of action of tamoxifen, adverse effects of tamoxifen as well today, role of life style modification in breast cancer prevention     Rachel Moulds, MD 09/10/2020 10:27 AM

## 2020-09-18 ENCOUNTER — Encounter: Payer: Self-pay | Admitting: Genetic Counselor

## 2020-09-18 ENCOUNTER — Telehealth: Payer: Self-pay | Admitting: Genetic Counselor

## 2020-09-18 DIAGNOSIS — Z1379 Encounter for other screening for genetic and chromosomal anomalies: Secondary | ICD-10-CM | POA: Insufficient documentation

## 2020-09-18 NOTE — Telephone Encounter (Signed)
Revealed negative genetic testing of [facility] BRCAPlus STAT Panel.  Discussed that we do not know why there is cancer in the family. It could be sporadic/familial, due to a change in a gene she did not inherit, due to a different gene that we are not testing, or maybe our current technology may not be able to pick something up.  It will be important for her to keep in contact with genetics to keep up with whether additional testing may be needed.  Results of pan-cancer panel are pending.

## 2020-10-05 ENCOUNTER — Other Ambulatory Visit: Payer: Self-pay | Admitting: Family

## 2020-10-06 ENCOUNTER — Telehealth: Payer: Self-pay | Admitting: Family

## 2020-10-06 NOTE — Telephone Encounter (Signed)
I left message asking patient to let us know if there was anything we could do to help her or anything she needs right now. I did let her know that I had left [facility] location and we would need to determine follow up for her blood pressure/ continued care. I asked her to MyChart back with what her plans for follow up were going to be.  #90 day refill given on blood pressure medication today.

## 2020-10-13 ENCOUNTER — Ambulatory Visit: Payer: Self-pay | Admitting: Genetic Counselor

## 2020-10-13 ENCOUNTER — Telehealth: Payer: Self-pay | Admitting: Genetic Counselor

## 2020-10-13 ENCOUNTER — Encounter: Payer: Self-pay | Admitting: Family

## 2020-10-13 DIAGNOSIS — Z8042 Family history of malignant neoplasm of prostate: Secondary | ICD-10-CM

## 2020-10-13 DIAGNOSIS — Z800 Family history of malignant neoplasm of digestive organs: Secondary | ICD-10-CM

## 2020-10-13 DIAGNOSIS — Z1379 Encounter for other screening for genetic and chromosomal anomalies: Secondary | ICD-10-CM

## 2020-10-13 DIAGNOSIS — Z803 Family history of malignant neoplasm of breast: Secondary | ICD-10-CM

## 2020-10-13 DIAGNOSIS — Z8041 Family history of malignant neoplasm of ovary: Secondary | ICD-10-CM

## 2020-10-13 NOTE — Telephone Encounter (Signed)
Revealed negative genetic testing of pan-cancer panel.  Discussed that we do not know why why there is cancer in the family. It could be familial, due to a change in a gene Tanya Raymond did not inherit, due to a different gene that we are not testing, or maybe our current technology may not be able to pick something up.  It will be important for her to keep in contact with genetics to keep up with whether additional testing may be needed.

## 2020-10-13 NOTE — Progress Notes (Signed)
HPI:  Tanya Raymond was previously seen in the [place] clinic due to a family history of cancer and concerns regarding a hereditary predisposition to cancer. Please refer to our prior cancer genetics clinic note for more information regarding our discussion, assessment and recommendations, at the time. Tanya Raymond recent genetic test results were disclosed to her, as were recommendations warranted by these results. These results and recommendations are discussed in more detail below.  CANCER HISTORY:  Oncology History   No history exists.    FAMILY HISTORY:  We obtained a detailed, 4-generation family history.  Significant diagnoses are listed below: Family History  Problem Relation Age of Onset   Hypertension Mother    Hypertension Father    Breast cancer Paternal Aunt 21   Prostate cancer Paternal Uncle 56       metastatic   Breast cancer Maternal Grandmother 67   Heart attack Maternal Grandfather    Heart attack Paternal Grandmother    Cancer Paternal Grandmother 66       unknown type; metastatic; ?colon   Heart attack Paternal Grandfather    Ovarian cancer Cousin 28       maternal cousin     GENETIC TEST RESULTS: Genetic testing reported out on October 07, 2020.  The CancerNext-Expanded +RNAinsight Panel through [facility] found no pathogenic mutations. The CancerNext-Expanded gene panel offered by [facility] and includes sequencing, rearrangement, and RNA analysis for the following 77 genes: AIP, ALK, APC, ATM, AXIN2, BAP1, BARD1, BLM, BMPR1A, BRCA1, BRCA2, BRIP1, CDC73, CDH1, CDK4, CDKN1B, CDKN2A, CHEK2, CTNNA1, DICER1, FANCC, FH, FLCN, GALNT12, KIF1B, LZTR1, MAX, MEN1, MET, MLH1, MSH2, MSH3, MSH6, MUTYH, NBN, NF1, NF2, NTHL1, PALB2, PHOX2B, PMS2, POT1, PRKAR1A, PTCH1, PTEN, RAD51C, RAD51D, RB1, RECQL, RET, SDHA, SDHAF2, SDHB, SDHC, SDHD, SMAD4, SMARCA4, SMARCB1, SMARCE1, STK11, SUFU, TMEM127, TP53, TSC1, TSC2, VHL and XRCC2 (sequencing and  deletion/duplication); EGFR, EGLN1, HOXB13, KIT, MITF, PDGFRA, POLD1, and POLE (sequencing only); EPCAM and GREM1 (deletion/duplication only).   The test report has been scanned into EPIC and is located under the Molecular Pathology section of the Results Review tab.  A portion of the result report is included below for reference.     We discussed with Tanya Raymond that because current genetic testing is not perfect, it is possible there may be a gene mutation in one of these genes that current testing cannot detect, but that chance is small.  We also discussed, that there could be another gene that has not yet been discovered, or that we have not yet tested, that is responsible for the cancer diagnoses in the family. It is also possible there is a hereditary cause for the cancer in the family that Tanya Raymond did not inherit and therefore was not identified in her testing.  Therefore, it is important to remain in touch with cancer genetics in the future so that we can continue to offer Tanya Raymond the most up to date genetic testing.      ADDITIONAL GENETIC TESTING: We discussed with Tanya Raymond that her genetic testing was fairly extensive.  If there are genes identified to increase cancer risk that can be analyzed in the future, we would be happy to discuss and coordinate this testing at that time.    CANCER SCREENING RECOMMENDATIONS: Tanya Raymond test result is considered negative (normal).  This means that we have not identified a hereditary cause for her family history of cancer at this time.   This does not definitively rule out  a hereditary predisposition to cancer. It is still possible that there could be genetic mutations that are undetectable by current technology. There could be genetic mutations in genes that have not been tested or identified to increase cancer risk.  Therefore, it is recommended she continue to follow the cancer management and screening guidelines provided by  her oncology and primary healthcare provider. 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 Marlou Starks and Dr. Chryl Heck for her cancer surveillance.   An individual's cancer risk and medical management are not determined by genetic test results alone. Overall cancer risk assessment incorporates additional factors, including personal medical history, family history, and any available genetic information that may result in a personalized plan for cancer prevention and surveillance.    RECOMMENDATIONS FOR FAMILY MEMBERS:  Individuals in this family might be at some increased risk of developing cancer, over the general population risk, simply due to the family history of cancer.  We recommended women in this family have a yearly mammogram beginning at age 38, or 39 years younger than the earliest onset of cancer, an annual clinical breast exam, and perform monthly breast self-exams. Women in this family should also have a gynecological exam as recommended by their primary provider. Family members should be referred for colonoscopy starting at age 56 or earlier, as recommended by their physicians.   It is also possible there is a hereditary cause for the cancer in Tanya Raymond's family that she did not inherit and therefore was not identified in her.  Based on Tanya Raymond's family history, we recommended her aunt with a breast cancer history and her uncle with prostate cancer history have genetic counseling and testing. Tanya Raymond will let us know if we can be of any assistance in coordinating genetic counseling and/or testing for these family members.   FOLLOW-UP: Lastly, we discussed with Tanya Raymond that cancer genetics is a rapidly advancing field and it is possible that new genetic tests will be appropriate for her and/or her family members in the future. We encouraged her to remain in contact with cancer genetics on an annual basis so  we can update her personal and family histories and let her know of advances in cancer genetics that may benefit this family.   Our contact number was provided. Tanya Raymond questions were answered to her satisfaction, and she knows she is welcome to call us at anytime with additional questions or concerns.     Jaleigh Mccroskey M. Joette Catching, [place], [facility] Genetic Counselor [email or web address] (P) [phone]

## 2020-10-22 ENCOUNTER — Encounter (HOSPITAL_BASED_OUTPATIENT_CLINIC_OR_DEPARTMENT_OTHER): Payer: Self-pay | Admitting: General Surgery

## 2020-10-22 ENCOUNTER — Other Ambulatory Visit: Payer: Self-pay

## 2020-10-27 ENCOUNTER — Ambulatory Visit
Admission: RE | Admit: 2020-10-27 | Discharge: 2020-10-27 | Disposition: A | Discharge status: Home / Self Care | Payer: BC Managed Care – PPO | Source: Ambulatory Visit | Attending: General Surgery | Admitting: General Surgery

## 2020-10-27 ENCOUNTER — Encounter (HOSPITAL_BASED_OUTPATIENT_CLINIC_OR_DEPARTMENT_OTHER)
Admission: RE | Admit: 2020-10-27 | Discharge: 2020-10-27 | Disposition: A | Discharge status: Home / Self Care | Payer: BC Managed Care – PPO | Source: Ambulatory Visit | Attending: General Surgery | Admitting: General Surgery

## 2020-10-27 ENCOUNTER — Other Ambulatory Visit: Payer: Self-pay

## 2020-10-27 DIAGNOSIS — Z01810 Encounter for preprocedural cardiovascular examination: Secondary | ICD-10-CM | POA: Insufficient documentation

## 2020-10-27 DIAGNOSIS — N6091 Unspecified benign mammary dysplasia of right breast: Secondary | ICD-10-CM

## 2020-10-27 DIAGNOSIS — N6012 Diffuse cystic mastopathy of left breast: Secondary | ICD-10-CM | POA: Diagnosis not present

## 2020-10-27 DIAGNOSIS — N6092 Unspecified benign mammary dysplasia of left breast: Secondary | ICD-10-CM

## 2020-10-27 DIAGNOSIS — Z803 Family history of malignant neoplasm of breast: Secondary | ICD-10-CM | POA: Diagnosis not present

## 2020-10-27 DIAGNOSIS — Z87891 Personal history of nicotine dependence: Secondary | ICD-10-CM | POA: Diagnosis not present

## 2020-10-27 DIAGNOSIS — N6082 Other benign mammary dysplasias of left breast: Secondary | ICD-10-CM | POA: Diagnosis not present

## 2020-10-27 DIAGNOSIS — N6081 Other benign mammary dysplasias of right breast: Secondary | ICD-10-CM | POA: Diagnosis not present

## 2020-10-27 DIAGNOSIS — Z8711 Personal history of peptic ulcer disease: Secondary | ICD-10-CM | POA: Diagnosis not present

## 2020-10-27 DIAGNOSIS — N6489 Other specified disorders of breast: Secondary | ICD-10-CM | POA: Diagnosis not present

## 2020-10-27 DIAGNOSIS — N6011 Diffuse cystic mastopathy of right breast: Secondary | ICD-10-CM | POA: Diagnosis not present

## 2020-10-27 MED ORDER — CHLORHEXIDINE GLUCONATE CLOTH 2 % EX PADS: 6.0000 | MEDICATED_PAD | Freq: Once | CUTANEOUS | Status: DC

## 2020-10-27 NOTE — Progress Notes (Signed)

## 2020-10-27 NOTE — Anesthesia Preprocedure Evaluation (Addendum)
Anesthesia Evaluation  Patient identified by MRN, date of birth, ID band Patient awake    Reviewed: Allergy & Precautions, NPO status , Patient's Chart, lab work & pertinent test results  History of Anesthesia Complications Negative for: history of anesthetic complications  Airway Mallampati: II  TM Distance: >3 FB Neck ROM: Full    Dental  (+) Dental Advisory Given, Teeth Intact   Pulmonary neg pulmonary ROS,    Pulmonary exam normal        Cardiovascular hypertension, Pt. on medications Normal cardiovascular exam     Neuro/Psych PSYCHIATRIC DISORDERS Anxiety negative neurological ROS     GI/Hepatic negative GI ROS, Neg liver ROS,   Endo/Other  Morbid obesity  Renal/GU negative Renal ROS     Musculoskeletal negative musculoskeletal ROS (+)   Abdominal   Peds  Hematology negative hematology ROS (+)   Anesthesia Other Findings   Reproductive/Obstetrics                           Anesthesia Physical Anesthesia Plan  ASA: 3  Anesthesia Plan: General   Post-op Pain Management:    Induction: Intravenous  PONV Risk Score and Plan: 3 and Treatment may vary due to age or medical condition, Ondansetron, Dexamethasone, Midazolam and Scopolamine patch - Pre-op  Airway Management Planned: LMA  Additional Equipment: None  Intra-op Plan:   Post-operative Plan: Extubation in OR  Informed Consent: I have reviewed the patients History and Physical, chart, labs and discussed the procedure including the risks, benefits and alternatives for the proposed anesthesia with the patient or authorized representative who has indicated his/her understanding and acceptance.     Dental advisory given  Plan Discussed with: CRNA and Anesthesiologist  Anesthesia Plan Comments:        Anesthesia Quick Evaluation

## 2020-10-28 ENCOUNTER — Ambulatory Visit (HOSPITAL_BASED_OUTPATIENT_CLINIC_OR_DEPARTMENT_OTHER)
Admission: RE | Admit: 2020-10-28 | Discharge: 2020-10-28 | Disposition: A | Discharge status: Home / Self Care | Payer: BC Managed Care – PPO | Attending: General Surgery | Admitting: General Surgery

## 2020-10-28 ENCOUNTER — Ambulatory Visit (HOSPITAL_BASED_OUTPATIENT_CLINIC_OR_DEPARTMENT_OTHER): Payer: BC Managed Care – PPO | Admitting: Anesthesiology

## 2020-10-28 ENCOUNTER — Other Ambulatory Visit: Payer: Self-pay

## 2020-10-28 ENCOUNTER — Encounter (HOSPITAL_BASED_OUTPATIENT_CLINIC_OR_DEPARTMENT_OTHER): Payer: Self-pay | Admitting: General Surgery

## 2020-10-28 ENCOUNTER — Ambulatory Visit
Admission: RE | Admit: 2020-10-28 | Discharge: 2020-10-28 | Disposition: A | Discharge status: Home / Self Care | Payer: BC Managed Care – PPO | Source: Ambulatory Visit | Attending: General Surgery | Admitting: General Surgery

## 2020-10-28 ENCOUNTER — Encounter (HOSPITAL_BASED_OUTPATIENT_CLINIC_OR_DEPARTMENT_OTHER)
Admission: RE | Disposition: A | Discharge status: Home / Self Care | Payer: Self-pay | Source: Home / Self Care | Attending: General Surgery

## 2020-10-28 DIAGNOSIS — N6012 Diffuse cystic mastopathy of left breast: Secondary | ICD-10-CM | POA: Diagnosis not present

## 2020-10-28 DIAGNOSIS — N6082 Other benign mammary dysplasias of left breast: Secondary | ICD-10-CM | POA: Insufficient documentation

## 2020-10-28 DIAGNOSIS — N6489 Other specified disorders of breast: Secondary | ICD-10-CM | POA: Insufficient documentation

## 2020-10-28 DIAGNOSIS — N6081 Other benign mammary dysplasias of right breast: Secondary | ICD-10-CM | POA: Insufficient documentation

## 2020-10-28 DIAGNOSIS — N6011 Diffuse cystic mastopathy of right breast: Secondary | ICD-10-CM | POA: Insufficient documentation

## 2020-10-28 DIAGNOSIS — Z8711 Personal history of peptic ulcer disease: Secondary | ICD-10-CM | POA: Insufficient documentation

## 2020-10-28 DIAGNOSIS — Z87891 Personal history of nicotine dependence: Secondary | ICD-10-CM | POA: Insufficient documentation

## 2020-10-28 DIAGNOSIS — Z803 Family history of malignant neoplasm of breast: Secondary | ICD-10-CM | POA: Insufficient documentation

## 2020-10-28 HISTORY — DX: Essential (primary) hypertension: I10

## 2020-10-28 HISTORY — PX: BREAST LUMPECTOMY WITH RADIOACTIVE SEED LOCALIZATION: SHX6424

## 2020-10-28 LAB — POCT PREGNANCY, URINE: Preg Test, Ur: NEGATIVE

## 2020-10-28 SURGERY — BREAST LUMPECTOMY WITH RADIOACTIVE SEED LOCALIZATION
Anesthesia: General | Site: Breast | Laterality: Bilateral

## 2020-10-28 MED ORDER — CEFAZOLIN SODIUM-DEXTROSE 2-4 GM/100ML-% IV SOLN
INTRAVENOUS | Status: AC
  Filled 2020-10-28: qty 100

## 2020-10-28 MED ORDER — CELECOXIB 200 MG PO CAPS
ORAL_CAPSULE | ORAL | Status: AC
  Filled 2020-10-28: qty 1

## 2020-10-28 MED ORDER — MIDAZOLAM HCL 5 MG/5ML IJ SOLN
INTRAMUSCULAR | Status: DC | PRN
  Administered 2020-10-28: 2 mg via INTRAVENOUS

## 2020-10-28 MED ORDER — ONDANSETRON HCL 4 MG/2ML IJ SOLN
INTRAMUSCULAR | Status: DC | PRN
  Administered 2020-10-28: 4 mg via INTRAVENOUS

## 2020-10-28 MED ORDER — FENTANYL CITRATE (PF) 100 MCG/2ML IJ SOLN
INTRAMUSCULAR | Status: AC
  Filled 2020-10-28: qty 2

## 2020-10-28 MED ORDER — DEXAMETHASONE SODIUM PHOSPHATE 10 MG/ML IJ SOLN
INTRAMUSCULAR | Status: AC
  Filled 2020-10-28: qty 1

## 2020-10-28 MED ORDER — FENTANYL CITRATE (PF) 100 MCG/2ML IJ SOLN: 25.0000 ug | INTRAMUSCULAR | Status: DC | PRN

## 2020-10-28 MED ORDER — PROMETHAZINE HCL 25 MG/ML IJ SOLN: 6.2500 mg | INTRAMUSCULAR | Status: DC | PRN

## 2020-10-28 MED ORDER — PROPOFOL 10 MG/ML IV BOLUS
INTRAVENOUS | Status: DC | PRN
  Administered 2020-10-28: 200 mg via INTRAVENOUS

## 2020-10-28 MED ORDER — BUPIVACAINE-EPINEPHRINE (PF) 0.25% -1:200000 IJ SOLN
INTRAMUSCULAR | Status: DC | PRN
  Administered 2020-10-28: 30 mL

## 2020-10-28 MED ORDER — PROPOFOL 10 MG/ML IV BOLUS
INTRAVENOUS | Status: AC
  Filled 2020-10-28: qty 20

## 2020-10-28 MED ORDER — ACETAMINOPHEN 500 MG PO TABS
ORAL_TABLET | ORAL | Status: AC
  Filled 2020-10-28: qty 2

## 2020-10-28 MED ORDER — BUPIVACAINE-EPINEPHRINE (PF) 0.25% -1:200000 IJ SOLN
INTRAMUSCULAR | Status: AC
  Filled 2020-10-28: qty 60

## 2020-10-28 MED ORDER — EPHEDRINE SULFATE 50 MG/ML IJ SOLN
INTRAMUSCULAR | Status: DC | PRN
  Administered 2020-10-28 (×3): 10 mg via INTRAVENOUS

## 2020-10-28 MED ORDER — CEFAZOLIN SODIUM-DEXTROSE 2-4 GM/100ML-% IV SOLN
2.0000 g | INTRAVENOUS | Status: AC
  Administered 2020-10-28: 2 g via INTRAVENOUS

## 2020-10-28 MED ORDER — SCOPOLAMINE 1 MG/3DAYS TD PT72
MEDICATED_PATCH | TRANSDERMAL | Status: AC
  Filled 2020-10-28: qty 1

## 2020-10-28 MED ORDER — EPHEDRINE SULFATE 50 MG/ML IJ SOLN: INTRAMUSCULAR | Status: DC | PRN

## 2020-10-28 MED ORDER — DEXAMETHASONE SODIUM PHOSPHATE 10 MG/ML IJ SOLN
INTRAMUSCULAR | Status: DC | PRN
  Administered 2020-10-28: 5 mg via INTRAVENOUS

## 2020-10-28 MED ORDER — ACETAMINOPHEN 500 MG PO TABS
1000.0000 mg | ORAL_TABLET | ORAL | Status: AC
Start: 2020-10-28 — End: 2020-10-28
  Administered 2020-10-28: 1000 mg via ORAL

## 2020-10-28 MED ORDER — LIDOCAINE HCL (CARDIAC) PF 100 MG/5ML IV SOSY
PREFILLED_SYRINGE | INTRAVENOUS | Status: DC | PRN
  Administered 2020-10-28: 60 mg via INTRATRACHEAL

## 2020-10-28 MED ORDER — HYDROCODONE-ACETAMINOPHEN 5-325 MG PO TABS: 1.0000 | ORAL_TABLET | Freq: Four times a day (QID) | ORAL | 0 refills | Status: DC | PRN

## 2020-10-28 MED ORDER — OXYCODONE HCL 5 MG/5ML PO SOLN: 5.0000 mg | Freq: Once | ORAL | Status: DC | PRN

## 2020-10-28 MED ORDER — LIDOCAINE HCL (PF) 2 % IJ SOLN
INTRAMUSCULAR | Status: AC
  Filled 2020-10-28: qty 5

## 2020-10-28 MED ORDER — OXYCODONE HCL 5 MG PO TABS
5.0000 mg | ORAL_TABLET | Freq: Once | ORAL | Status: DC | PRN
Start: 2020-10-28 — End: 2020-10-28

## 2020-10-28 MED ORDER — GABAPENTIN 300 MG PO CAPS
ORAL_CAPSULE | ORAL | Status: AC
  Filled 2020-10-28: qty 1

## 2020-10-28 MED ORDER — MIDAZOLAM HCL 2 MG/2ML IJ SOLN
INTRAMUSCULAR | Status: AC
  Filled 2020-10-28: qty 2

## 2020-10-28 MED ORDER — LACTATED RINGERS IV SOLN: INTRAVENOUS | Status: DC

## 2020-10-28 MED ORDER — CELECOXIB 200 MG PO CAPS
200.0000 mg | ORAL_CAPSULE | ORAL | Status: AC
  Administered 2020-10-28: 200 mg via ORAL

## 2020-10-28 MED ORDER — ONDANSETRON HCL 4 MG/2ML IJ SOLN
INTRAMUSCULAR | Status: AC
  Filled 2020-10-28: qty 2

## 2020-10-28 MED ORDER — GABAPENTIN 300 MG PO CAPS
300.0000 mg | ORAL_CAPSULE | ORAL | Status: AC
  Administered 2020-10-28: 300 mg via ORAL

## 2020-10-28 MED ORDER — FENTANYL CITRATE (PF) 100 MCG/2ML IJ SOLN
INTRAMUSCULAR | Status: DC | PRN
  Administered 2020-10-28: 25 ug via INTRAVENOUS
  Administered 2020-10-28: 50 ug via INTRAVENOUS
  Administered 2020-10-28: 25 ug via INTRAVENOUS
  Administered 2020-10-28 (×2): 50 ug via INTRAVENOUS

## 2020-10-28 MED ORDER — SCOPOLAMINE 1 MG/3DAYS TD PT72
MEDICATED_PATCH | TRANSDERMAL | Status: DC | PRN
  Administered 2020-10-28: 1 via TRANSDERMAL

## 2020-10-28 SURGICAL SUPPLY — 42 items
ADH SKN CLS APL DERMABOND .7 (GAUZE/BANDAGES/DRESSINGS) ×1
APL PRP STRL LF DISP 70% ISPRP (MISCELLANEOUS) ×2
APPLIER CLIP 9.375 MED OPEN (MISCELLANEOUS)
APR CLP MED 9.3 20 MLT OPN (MISCELLANEOUS)
BLADE SURG 15 STRL LF DISP TIS (BLADE) ×1 IMPLANT
BLADE SURG 15 STRL SS (BLADE) ×2
CANISTER SUC SOCK COL 7IN (MISCELLANEOUS) IMPLANT
CANISTER SUCT 1200ML W/VALVE (MISCELLANEOUS) IMPLANT
CHLORAPREP W/TINT 26 (MISCELLANEOUS) ×4 IMPLANT
CLIP APPLIE 9.375 MED OPEN (MISCELLANEOUS) IMPLANT
COVER BACK TABLE 60X90IN (DRAPES) ×2 IMPLANT
COVER MAYO STAND STRL (DRAPES) ×2 IMPLANT
COVER PROBE W GEL 5X96 (DRAPES) ×2 IMPLANT
DECANTER SPIKE VIAL GLASS SM (MISCELLANEOUS) IMPLANT
DERMABOND ADVANCED (GAUZE/BANDAGES/DRESSINGS) ×1
DERMABOND ADVANCED .7 DNX12 (GAUZE/BANDAGES/DRESSINGS) ×1 IMPLANT
DRAPE LAPAROSCOPIC ABDOMINAL (DRAPES) ×2 IMPLANT
DRAPE UTILITY XL STRL (DRAPES) ×2 IMPLANT
ELECT COATED BLADE 2.86 ST (ELECTRODE) ×2 IMPLANT
ELECT REM PT RETURN 9FT ADLT (ELECTROSURGICAL) ×2
ELECTRODE REM PT RTRN 9FT ADLT (ELECTROSURGICAL) ×1 IMPLANT
GLOVE SURG ENC MOIS LTX SZ7.5 (GLOVE) ×4 IMPLANT
GLOVE SURG UNDER POLY LF SZ7 (GLOVE) ×2 IMPLANT
GOWN STRL REUS W/ TWL LRG LVL3 (GOWN DISPOSABLE) ×2 IMPLANT
GOWN STRL REUS W/TWL LRG LVL3 (GOWN DISPOSABLE) ×4
ILLUMINATOR WAVEGUIDE N/F (MISCELLANEOUS) IMPLANT
KIT MARKER MARGIN INK (KITS) ×2 IMPLANT
LIGHT WAVEGUIDE WIDE FLAT (MISCELLANEOUS) IMPLANT
NEEDLE HYPO 25X1 1.5 SAFETY (NEEDLE) ×2 IMPLANT
NS IRRIG 1000ML POUR BTL (IV SOLUTION) ×2 IMPLANT
PACK BASIN DAY SURGERY FS (CUSTOM PROCEDURE TRAY) ×2 IMPLANT
PENCIL SMOKE EVACUATOR (MISCELLANEOUS) ×2 IMPLANT
SLEEVE SCD COMPRESS KNEE MED (STOCKING) ×2 IMPLANT
SPONGE T-LAP 18X18 ~~LOC~~+RFID (SPONGE) ×2 IMPLANT
SUT MON AB 4-0 PC3 18 (SUTURE) ×4 IMPLANT
SUT SILK 2 0 SH (SUTURE) IMPLANT
SUT VICRYL 3-0 CR8 SH (SUTURE) ×2 IMPLANT
SYR CONTROL 10ML LL (SYRINGE) ×2 IMPLANT
TOWEL GREEN STERILE FF (TOWEL DISPOSABLE) ×2 IMPLANT
TRAY FAXITRON CT DISP (TRAY / TRAY PROCEDURE) ×6 IMPLANT
TUBE CONNECTING 20X1/4 (TUBING) IMPLANT
YANKAUER SUCT BULB TIP NO VENT (SUCTIONS) IMPLANT

## 2020-10-28 NOTE — Anesthesia Procedure Notes (Signed)
Procedure Name: LMA Insertion Date/Time: 10/28/2020 8:42 AM Performed by: Thornell Mule, CRNA Pre-anesthesia Checklist: Patient identified, Emergency Drugs available, Suction available and Patient being monitored Patient Re-evaluated:Patient Re-evaluated prior to induction Oxygen Delivery Method: Circle system utilized Preoxygenation: Pre-oxygenation with 100% oxygen Induction Type: IV induction LMA: LMA inserted LMA Size: 4.0 Number of attempts: 1 Placement Confirmation: positive ETCO2 Tube secured with: Tape Dental Injury: Teeth and Oropharynx as per pre-operative assessment

## 2020-10-28 NOTE — Interval H&P Note (Signed)
History and Physical Interval Note:  10/28/2020 8:02 AM  Tanya Raymond  has presented today for surgery, with the diagnosis of BILATERAL BREAST ATYPIA.  The various methods of treatment have been discussed with the patient and family. After consideration of risks, benefits and other options for treatment, the patient has consented to  Procedure(s): BILATERAL BREAST LUMPECTOMY WITH RADIOACTIVE SEED LOCALIZATION x2 RIGHT AND ONE ON LEFT(Bilateral) as a surgical intervention.  The patient's history has been reviewed, patient examined, no change in status, stable for surgery.  I have reviewed the patient's chart and labs.  Questions were answered to the patient's satisfaction.     [name]

## 2020-10-28 NOTE — Transfer of Care (Signed)
Immediate Anesthesia Transfer of Care Note  Patient: Tanya Raymond  Procedure(s) Performed: BILATERAL BREAST LUMPECTOMY WITH RADIOACTIVE SEED LOCALIZATION x2 RIGHT AND X1 LEFT (Bilateral: Breast)  Patient Location: PACU  Anesthesia Type:General  Level of Consciousness: drowsy, patient cooperative and responds to stimulation  Airway & Oxygen Therapy: Patient Spontanous Breathing and Patient connected to face mask oxygen  Post-op Assessment: Report given to RN and Post -op Vital signs reviewed and stable  Post vital signs: Reviewed and stable  Last Vitals:  Vitals Value Taken Time  BP    Temp    Pulse 87 10/28/20 1008  Resp 25 10/28/20 1008  SpO2 97 % 10/28/20 1008  Vitals shown include unvalidated device data.  Last Pain:  Vitals:   10/28/20 0708  TempSrc: Oral  PainSc: 2       Patients Stated Pain Goal: 2 (10/28/20 0708)  Complications: No notable events documented.

## 2020-10-28 NOTE — H&P (Signed)
Tanya Raymond  Location: [facility] Patient #: [id number] DOB: 1976-03-22 Married / Language: English / Race: White Female   History of Present Illness  The patient is a 45 year old female who presents with a complaint of Breast problems. We are asked to see the patient in consultation by Dr. Gertie Exon and to evaluate her for atypical duct hyperplasia in the right breast.  The patient is a 45 year old white female who recently went for a routine screening mammogram.  At that time she was found to have a 2.3 cm area of calcification in the upper outer quadrant of the right breast as well as a small area of calcification in the central left breast.  She had 3 biopsies on the right.  One was benign, one had atypical duct hyperplasia, and the third had other atypical cells.  She had 1 biopsy on the left that had also atypical cells.  She does have a history of breast cancer in a maternal grandmother and a paternal aunt.  She does not smoke.   Past Surgical History  Breast Biopsy   Bilateral.  Diagnostic Studies History  Colonoscopy   never Mammogram   within last year Pap Smear   1-5 years ago  Allergies  No Known Drug Allergies     Medication History  Escitalopram Oxalate  (5MG  Tablet, Oral) Active. Losartan Potassium  (100MG  Tablet, Oral) Active. Losartan Potassium  (50MG  Tablet, Oral) Active. Medications Reconciled   Social History  Alcohol use   Occasional alcohol use. Caffeine use   Carbonated beverages, Tea. No drug use   Tobacco use   Former smoker.  Family History  Breast Cancer   Family Members In General. Colon Cancer   Family Members In General. Colon Polyps   Sister. Depression   Mother. Diabetes Mellitus   Family Members In General. Heart Disease   Family Members In General. Heart disease in female family member before age 63   Hypertension   Father, Mother.  Pregnancy / Birth History  Age at menarche   14 years. Contraceptive History    Intrauterine device, Oral contraceptives. Gravida   7 Length (months) of breastfeeding   12-24 Maternal age   84-35 Para   1 Regular periods    Other Problems  Anxiety Disorder   Back Pain   Gastric Ulcer   High blood pressure      Review of Systems  General Not Present- Appetite Loss, Chills, Fatigue, Fever, Night Sweats, Weight Gain and Weight Loss. HEENT Present- Nose Bleed and Seasonal Allergies. Not Present- Earache, Hearing Loss, Hoarseness, Oral Ulcers, Ringing in the Ears, Sinus Pain, Sore Throat, Visual Disturbances, Wears glasses/contact lenses and Yellow Eyes. Respiratory Present- Snoring. Not Present- Bloody sputum, Chronic Cough, Difficulty Breathing and Wheezing. Breast Not Present- Breast Mass, Breast Pain, Nipple Discharge and Skin Changes. Cardiovascular Not Present- Chest Pain, Difficulty Breathing Lying Down, Leg Cramps, Palpitations, Rapid Heart Rate, Shortness of Breath and Swelling of Extremities. Gastrointestinal Not Present- Abdominal Pain, Bloating, Bloody Stool, Change in Bowel Habits, Chronic diarrhea, Constipation, Difficulty Swallowing, Excessive gas, Gets full quickly at meals, Hemorrhoids, Indigestion, Nausea, Rectal Pain and Vomiting. Female Genitourinary Not Present- Frequency, Nocturia, Painful Urination, Pelvic Pain and Urgency. Musculoskeletal Present- Back Pain. Not Present- Joint Pain, Joint Stiffness, Muscle Pain, Muscle Weakness and Swelling of Extremities. Neurological Present- Headaches. Not Present- Decreased Memory, Fainting, Numbness, Seizures, Tingling, Tremor, Trouble walking and Weakness. Psychiatric Present- Anxiety. Not Present- Bipolar, Change in Sleep Pattern, Depression, Fearful and Frequent  crying. Endocrine Not Present- Cold Intolerance, Excessive Hunger, Hair Changes, Heat Intolerance, Hot flashes and New Diabetes. Hematology Not Present- Blood Thinners, Easy Bruising, Excessive bleeding, Gland problems, HIV and Persistent  Infections.  Vitals  Weight: 249.38 lb   Height: 66 in  Body Surface Area: 2.2 m   Body Mass Index: 40.25 kg/m   Temp.: 94.2 F    Pulse: 98 (Regular)    P.OX: 96% (Room air) BP: 160/100(Sitting, Left Arm, Standard)       Physical Exam  General Mental Status - Alert. General Appearance - Consistent with stated age. Hydration - Well hydrated. Voice - Normal.  Head and Neck Head - normocephalic, atraumatic with no lesions or palpable masses. Trachea - midline. Thyroid Gland Characteristics - normal size and consistency.  Eye Eyeball - Bilateral - Extraocular movements intact. Sclera/Conjunctiva - Bilateral - No scleral icterus.  Chest and Lung Exam Chest and lung exam reveals  - quiet, even and easy respiratory effort with no use of accessory muscles and on auscultation, normal breath sounds, no adventitious sounds and normal vocal resonance. Inspection Chest Wall - Normal. Back - normal.  Breast Note:  There is no palpable mass in either breast. There is no palpable axillary, supraclavicular, or cervical lymphadenopathy.   Cardiovascular Cardiovascular examination reveals  - normal heart sounds, regular rate and rhythm with no murmurs and normal pedal pulses bilaterally.  Abdomen Inspection Inspection of the abdomen reveals - No Hernias. Skin - Scar - no surgical scars. Palpation/Percussion Palpation and Percussion of the abdomen reveal - Soft, Non Tender, No Rebound tenderness, No Rigidity (guarding) and No hepatosplenomegaly. Auscultation Auscultation of the abdomen reveals - Bowel sounds normal.  Neurologic Neurologic evaluation reveals  - alert and oriented x 3 with no impairment of recent or remote memory. Mental Status - Normal.  Musculoskeletal Normal Exam - Left - Upper Extremity Strength Normal and Lower Extremity Strength Normal. Normal Exam - Right - Upper Extremity Strength Normal and Lower Extremity Strength Normal.  Lymphatic Head &  Neck  General Head & Neck Lymphatics: Bilateral - Description - Normal. Axillary  General Axillary Region: Bilateral - Description - Normal. Tenderness - Non Tender. Femoral & Inguinal  Generalized Femoral & Inguinal Lymphatics: Bilateral - Description - Normal. Tenderness - Non Tender.    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 The presence of this does increase her risk of breast cancer during her lifetime to approximate 40%. I have talked to her in detail about all the different options for management from observation to enhance screening to surgery. At this point I will refer her to the high-risk clinic at the cancer center to talk about risk reduction as well as to genetics. She would like to get more information before making a final decision as to how she would like to proceed. If she chooses observation and I will plan to see her back in about 6 months. I have discussed with her in detail the risks and benefits of the operations as well as some of the technical aspects and she understands. If she chooses mastectomy she would also need a referral to plastic surgery to talk about reconstruction. This patient encounter took 60 minutes today to perform the following: take history, perform exam,  review outside records, interpret imaging, counsel the patient on their diagnosis and document encounter, findings & plan in the EHR Current Plans Referred to Oncology, for evaluation and follow up (Oncology). Routine. Referred to Genetic Counseling, for evaluation and follow up [employer]). Routine. Follow up with [name] in the office in 6 MONTHS.    Call us sooner as needed.  Pt has elected for 3 lumpectomies, 2 on right and one on left

## 2020-10-28 NOTE — Anesthesia Postprocedure Evaluation (Signed)
Anesthesia Post Note  Patient: Tanya Raymond  Procedure(s) Performed: BILATERAL BREAST LUMPECTOMY WITH RADIOACTIVE SEED LOCALIZATION x2 RIGHT AND X1 LEFT (Bilateral: Breast)     Patient location during evaluation: PACU Anesthesia Type: General Level of consciousness: awake and alert Pain management: pain level controlled Vital Signs Assessment: post-procedure vital signs reviewed and stable Respiratory status: spontaneous breathing, nonlabored ventilation and respiratory function stable Cardiovascular status: blood pressure returned to baseline and stable Postop Assessment: no apparent nausea or vomiting Anesthetic complications: no   No notable events documented.  Last Vitals:  Vitals:   10/28/20 1030 10/28/20 1049  BP: 134/82 (!) 143/88  Pulse: 84 87  Resp: 19 17  Temp:  36.9 C  SpO2: 94% 94%    Last Pain:  Vitals:   10/28/20 1049  TempSrc:   PainSc: 0-No pain                 Beryle Lathe

## 2020-10-28 NOTE — Discharge Instructions (Signed)
May take Tylenol after 1pm, if needed.  May take Ibuprofen after 1pm, if needed.    Post Anesthesia Home Care Instructions  Activity: Get plenty of rest for the remainder of the day. A responsible individual must stay with you for 24 hours following the procedure.  For the next 24 hours, DO NOT: -Drive a car -Advertising copywriter -Drink alcoholic beverages -Take any medication unless instructed by your physician -Make any legal decisions or sign important papers.  Meals: Start with liquid foods such as gelatin or soup. Progress to regular foods as tolerated. Avoid greasy, spicy, heavy foods. If nausea and/or vomiting occur, drink only clear liquids until the nausea and/or vomiting subsides. Call your physician if vomiting continues.  Special Instructions/Symptoms: Your throat may feel dry or sore from the anesthesia or the breathing tube placed in your throat during surgery. If this causes discomfort, gargle with warm salt water. The discomfort should disappear within 24 hours.  If you had a scopolamine patch placed behind your ear for the management of post- operative nausea and/or vomiting:  1. The medication in the patch is effective for 72 hours, after which it should be removed.  Wrap patch in a tissue and discard in the trash. Wash hands thoroughly with soap and water. 2. You may remove the patch earlier than 72 hours if you experience unpleasant side effects which may include dry mouth, dizziness or visual disturbances. 3. Avoid touching the patch. Wash your hands with soap and water after contact with the patch.

## 2020-10-28 NOTE — Op Note (Signed)
10/28/2020  10:01 AM  PATIENT:  Tanya Raymond On  45 y.o. female  PRE-OPERATIVE DIAGNOSIS:  BILATERAL BREAST ATYPIA  POST-OPERATIVE DIAGNOSIS:  BILATERAL BREAST ATYPIA  PROCEDURE:  Procedure(s): RIGHT BREAST LUMPECTOMY WITH RADIOACTIVE SEED LOCALIZATION x2 AND LEFT BREAST RADIOACTIVE SEED LOCALIZED LUMPECTOMY  SURGEON:  Surgeon(s) and Role:    * Griselda Miner, MD - Primary  PHYSICIAN ASSISTANT:   ASSISTANTS: none   ANESTHESIA:   local and general  EBL:  minimal   BLOOD ADMINISTERED:none  DRAINS: none   LOCAL MEDICATIONS USED:  MARCAINE     SPECIMEN:  Source of Specimen:  left breast tissue, right breast tissue anterior and medial, right breast tissue posterior and lateral  DISPOSITION OF SPECIMEN:  PATHOLOGY  COUNTS:  YES  TOURNIQUET:  * No tourniquets in log *  DICTATION: .Dragon Dictation  After informed consent was obtained the patient was brought to the operating room and placed in the supine position on the operating table.  After adequate induction of general anesthesia the patient's bilateral chest and and breast area were prepped with ChloraPrep, allowed to dry, and draped in usual sterile manner.  An appropriate timeout was performed.  Previously 1 I-125 seed was placed in the left breast and 2 in the right breast to mark areas of atypia.  Attention was first turned to the left breast.  The seed was actually poking through the skin in the lower portion of the left breast.  The area around this was infiltrated with quarter percent Marcaine.  An elliptical incision was made around the radioactive seed with a 15 blade knife.  The incision was carried through the skin and subcutaneous tissue sharply with the electrocautery.  A circular portion of breast tissue was then excised beneath the seed sharply with the electrocautery.  Once the tissue was removed it was oriented with the appropriate paint colors.  A specimen radiograph was obtained that showed the clip and  seed to be within the specimen.  The specimen was then sent to pathology for further evaluation.  Hemostasis was achieved using the Bovie electrocautery.  The wound was irrigated with saline and infiltrated with more quarter percent Marcaine.  The deep layer of the wound was closed with interrupted 3-0 Vicryl stitches.  The skin was closed with a running 4-0 Monocryl subcuticular stitch.  Attention was then turned to the right breast.  The neoprobe was used to identify the 2 radioactive seeds in the upper outer quadrant.  The area around this was infiltrated with quarter percent Marcaine.  A curvilinear incision was made in the upper outer breast overlying the area of the radioactivity with a 15 blade knife.  The incision was carried through the skin and subcutaneous tissue sharply with the electrocautery.  Dissection was then carried towards the radioactive seeds under the direction of the neoprobe.  Once I more closely approached each seed I then removed a circular portion of breast tissue sharply around each seed while checking the area of radioactivity frequently.  Once the specimen was removed that was oriented with the appropriate paint colors.  Specimen radiographs were obtained that showed the clips and seeds to be within the specimens.  The specimens were then sent to pathology for further evaluation.  Of note the anterior surface of the more posterior and lateral specimen corresponded to the posterior surface of the more anterior and medial specimen.  Hemostasis was achieved using the Bovie electrocautery.  The wound was irrigated with saline and infiltrated with more  quarter percent Marcaine.  The deep layer of the wound was then closed with layers of interrupted 3-0 Vicryl stitches.  The skin was closed with a running 4-0 Monocryl subcuticular stitch.  Dermabond dressings were applied.  The patient tolerated the procedure well.  At the end of the case all needle sponge and instrument counts were correct.   The patient was then awakened and taken to recovery in stable condition.  PLAN OF CARE: Discharge to home after PACU  PATIENT DISPOSITION:  PACU - hemodynamically stable.   Delay start of Pharmacological VTE agent (>24hrs) due to surgical blood loss or risk of bleeding: not applicable

## 2020-10-29 ENCOUNTER — Encounter (HOSPITAL_BASED_OUTPATIENT_CLINIC_OR_DEPARTMENT_OTHER): Payer: Self-pay | Admitting: General Surgery

## 2020-11-02 LAB — SURGICAL PATHOLOGY

## 2020-11-17 NOTE — Progress Notes (Signed)
Tanya Raymond NOTE  Patient Care Team: Marrian Salvage, FNP as PCP - General (Internal Medicine)  CHIEF COMPLAINTS/PURPOSE OF CONSULTATION:  Atypical ductal hyperplasia.  ASSESSMENT & PLAN:   No problem-specific Assessment & Plan notes found for this encounter.  No orders of the defined types were placed in this encounter.  This is a very pleasant 45 year old female patient who was initially seen by atypical ductal hyperplasia back in June and high risk breast clinic who is here for follow-up after bilateral lumpectomy.  During her last visit we have discussed about difference between ADH, DCIS and invasive breast cancer and we have discussed the risks and benefits of tamoxifen therapy.  However at that time she was not sure if she wanted to proceed with a lumpectomy versus mastectomy hence we have discussed about following up after surgery and she is here for additional recommendations. 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 We have once again discussed about role of tamoxifen and breast cancer prevention in patients with atypical ductal hyperplasia but given clear pathology without atypia and negative genetic testing, she would like to wait on tamoxifen.  She is not excited about the adverse effects of tamoxifen.  We have discussed about considering annual MRI screening since her lifetime risk of breast cancer is greater than 30% given atypical ductal hyperplasia.  She is agreeable to this.  We have discussed unknown long-term risk of gadolinium as well as increased biopsy needs with MRIs given high sensitivity. MRI ordered for October of this year and she will follow-up with me later part of this year.  Thank you for consulting [name] in the care of this patient.  Please do not hesitate to contact us with any additional questions or concerns.  HISTORY  OF PRESENTING ILLNESS:  Tanya Raymond 45 y.o. female is here because of ADH   Tanya Raymond is a very pleasant 45 yr old female patient with PMH significant for HTN, depression referred to hematology for abnormal mammogram.  Screening mammogram 06/09/2020 IMPRESSION: Further evaluation is suggested for possible mass with calcifications in the right breast.   Further evaluation is suggested for possible mass with calcifications in the left breast.  07/01/20  Diagnostic mammogram : Indeterminate right breast calcifications forming 2-3 distinct groups in the upper outer quadrant. Recommendation is for stereotactic biopsy of the largest group of calcifications. If these demonstrate benign pathology, additional six-month follow-up is recommended for the additional calcifications. 2. Probably benign left breast calcifications. If above pathology demonstrates benign findings, additional six-month follow-up is recommended.  07/03/2020  Right breast needle core biopsy showed ADH  07/15/2020  Other biopsies showed atypical apocrine hyperplasia and fibrocystic changes.  Interval History  Since last visit, she had right and left breast lumpectomy on 10/28/2020 by Dr Marlou Starks. Pathology showed fibrocystic change, extensive biopsy site changes, no atypia or malignancy in left breast. Right breast showed fibrocystic change with apocrine metaplasia, complex sclerosing lesion, pseudoangiomatous stromal hyperplasia, extensive biopsy site change. No atypia or malignancy. She is here for FU, healing well. No complaints today.  She would like to wait on the tamoxifen for now.  She is however interested in annual MRI screening.  MEDICAL HISTORY:  Past Medical History:  Diagnosis Date   Anxiety    Family history of breast cancer 09/10/2020   Family history of colon cancer 09/10/2020   Family history of prostate cancer 09/10/2020  FH: ovarian cancer 09/10/2020   Hypertension     SURGICAL  HISTORY: Past Surgical History:  Procedure Laterality Date   BREAST BIOPSY Left 03/19/2019    FIBROCYSTIC CHANGES WITH CALCIFICATIONS    BREAST LUMPECTOMY WITH RADIOACTIVE SEED LOCALIZATION Bilateral 10/28/2020   Procedure: BILATERAL BREAST LUMPECTOMY WITH RADIOACTIVE SEED LOCALIZATION x2 RIGHT AND X1 LEFT;  Surgeon: Jovita Kussmaul, MD;  Location: [facility];  Service: General;  Laterality: Bilateral;   OTHER SURGICAL HISTORY     IVF   WISDOM TOOTH EXTRACTION      SOCIAL HISTORY: Social History   Socioeconomic History   Marital status: Married    Spouse name: Not on file   Number of children: Not on file   Years of education: Not on file   Highest education level: Not on file  Occupational History   Not on file  Tobacco Use   Smoking status: Never   Smokeless tobacco: Never  Vaping Use   Vaping Use: Never used  Substance and Sexual Activity   Alcohol use: Yes    Comment: Socially    Drug use: Not Currently   Sexual activity: Yes    Birth control/protection: I.U.D.  Other Topics Concern   Not on file  Social History Narrative   Not on file   Social Determinants of Health   Financial Resource Strain: Not on file  Food Insecurity: Not on file  Transportation Needs: Not on file  Physical Activity: Not on file  Stress: Not on file  Social Connections: Not on file  Intimate Partner Violence: Not on file    FAMILY HISTORY: Family History  Problem Relation Age of Onset   Hypertension Mother    Hypertension Father    Breast cancer Paternal Aunt 47   Prostate cancer Paternal Uncle 56       metastatic   Breast cancer Maternal Grandmother 63   Heart attack Maternal Grandfather    Heart attack Paternal Grandmother    Cancer Paternal Grandmother 70       unknown type; metastatic; ?colon   Heart attack Paternal Grandfather    Ovarian cancer Cousin 5       maternal cousin    ALLERGIES:  has No Known Allergies.  MEDICATIONS:  Current Outpatient  Medications  Medication Sig Dispense Refill   escitalopram (LEXAPRO) 5 MG tablet TAKE 1 TABLET BY MOUTH EVERY DAY 90 tablet 0   levonorgestrel (MIRENA) 20 MCG/24HR IUD 1 each by Intrauterine route once.     loratadine (CLARITIN) 10 MG tablet Take 10 mg by mouth daily.     MAGNESIUM PO Take by mouth.     Multiple Vitamin (MULTIVITAMIN PO) Take by mouth.     VITAMIN D PO Take by mouth.     HYDROcodone-acetaminophen (NORCO/VICODIN) 5-325 MG tablet Take 1-2 tablets by mouth every 6 (six) hours as needed for moderate pain or severe pain. 15 tablet 0   losartan (COZAAR) 100 MG tablet TAKE 1 TABLET BY MOUTH EVERY DAY 90 tablet 0   No current facility-administered medications for this visit.     PHYSICAL EXAMINATION:  ECOG PERFORMANCE STATUS: 0 - Asymptomatic  Vitals:   11/18/20 1109  BP: (!) 156/110  Resp: 18  Temp: 98.3 F (36.8 C)    Filed Weights   11/18/20 1109  Weight: 248 lb 4 oz (112.6 kg)     PE deferred today in lieu of counseling  LABORATORY DATA:  I have reviewed the data as listed Lab Results  Component Value Date   WBC 4.1 02/11/2019   HGB 13.3 02/11/2019   HCT 40.1 02/11/2019   MCV 92.1 02/11/2019   PLT 213.0 02/11/2019     Chemistry      Component Value Date/Time   NA 141 02/11/2019 0954   K 3.8 02/11/2019 0954   CL 106 02/11/2019 0954   CO2 26 02/11/2019 0954   BUN 11 02/11/2019 0954   CREATININE 0.82 02/11/2019 0954      Component Value Date/Time   CALCIUM 9.1 02/11/2019 0954   ALKPHOS 83 02/11/2019 0954   AST 14 02/11/2019 0954   ALT 13 02/11/2019 0954   BILITOT 0.3 02/11/2019 0954       RADIOGRAPHIC STUDIES: I have personally reviewed the radiological images as listed and agreed with the findings in the report. MM Breast Surgical Specimen  Result Date: 10/28/2020 CLINICAL DATA:  Right breast excision following radioactive seed localization of 2 lesions. EXAM: SPECIMEN RADIOGRAPH OF THE RIGHT BREAST: 2 SPECIMEN MAMMOGRAMS REVIEWED  COMPARISON:  Previous exam(s). FINDINGS: Status post excision of the right breast. In both specimens, the radioactive seeds and respective post biopsy marker clips are present, completely intact, and were marked for pathology. IMPRESSION: Specimen radiograph of the right breast. Electronically Signed   By: Lajean Manes M.D.   On: 10/28/2020 09:54  MM Breast Surgical Specimen  Result Date: 10/28/2020 CLINICAL DATA:  Right breast excision following radioactive seed localization of 2 lesions. EXAM: SPECIMEN RADIOGRAPH OF THE RIGHT BREAST: 2 SPECIMEN MAMMOGRAMS REVIEWED COMPARISON:  Previous exam(s). FINDINGS: Status post excision of the right breast. In both specimens, the radioactive seeds and respective post biopsy marker clips are present, completely intact, and were marked for pathology. IMPRESSION: Specimen radiograph of the right breast. Electronically Signed   By: Lajean Manes M.D.   On: 10/28/2020 09:54  MM Breast Surgical Specimen  Result Date: 10/28/2020 CLINICAL DATA:  Post excision of the left breast lesion following radioactive seed localization. Evaluate surgical specimen. EXAM: SPECIMEN RADIOGRAPH OF THE LEFT BREAST COMPARISON:  Previous exam(s). FINDINGS: Status post excision of the left breast. The radioactive seed and X shaped biopsy marker clip are present, completely intact, and were marked for pathology. IMPRESSION: Specimen radiograph of the left breast. Electronically Signed   By: Lajean Manes M.D.   On: 10/28/2020 09:13  MM LT RADIOACTIVE SEED LOC MAMMO GUIDE  Result Date: 10/27/2020 CLINICAL DATA:  Patient for preoperative localization prior to left breast excision. EXAM: MAMMOGRAPHIC GUIDED RADIOACTIVE SEED LOCALIZATION OF THE LEFT BREAST COMPARISON:  Previous exam(s). FINDINGS: Patient presents for radioactive seed localization prior to left breast excision. I met with the patient and we discussed the procedure of seed localization including benefits and alternatives. We  discussed the high likelihood of a successful procedure. We discussed the risks of the procedure including infection, bleeding, tissue injury and further surgery. We discussed the low dose of radioactivity involved in the procedure. Informed, written consent was given. The usual time-out protocol was performed immediately prior to the procedure. Using mammographic guidance, sterile technique, 1% lidocaine and an I-125 radioactive seed, X shaped clip was localized using a lateral approach. The follow-up mammogram images confirm the seed in the expected location and were marked for Dr. Marlou Starks. Follow-up survey of the patient confirms presence of the radioactive seed. Order number of I-125 seed:  [id number]. Total activity:  5.784 millicuries reference Date: 10/21/2020 The patient tolerated the procedure well and was released from the [facility]. She was given instructions regarding seed  removal. IMPRESSION: Radioactive seed localization left breast. No apparent complications. Electronically Signed   By: Lovey Newcomer M.D.   On: 10/27/2020 10:10  MM RT RADIOACTIVE SEED LOC MAMMO GUIDE  Result Date: 10/27/2020 CLINICAL DATA:  Patient for preoperative localization prior to right breast excision, 2 sites EXAM: MAMMOGRAPHIC GUIDED RADIOACTIVE SEED LOCALIZATION OF THE RIGHT BREAST COMPARISON:  Previous exam(s). FINDINGS: Patient presents for radioactive seed localization prior to right breast excision, 2 sites. I met with the patient and we discussed the procedure of seed localization including benefits and alternatives. We discussed the high likelihood of a successful procedure. We discussed the risks of the procedure including infection, bleeding, tissue injury and further surgery. We discussed the low dose of radioactivity involved in the procedure. Informed, written consent was given. The usual time-out protocol was performed immediately prior to the procedure. Site 1: Right breast X shaped clip Using mammographic  guidance, sterile technique, 1% lidocaine and an I-125 radioactive seed, X shaped clip was localized using a lateral approach. The follow-up mammogram images confirm the seed in the expected location and were marked for Dr. Marlou Starks. Follow-up survey of the patient confirms presence of the radioactive seed. Order number of I-125 seed:  [id number]. Total activity:  0.962 millicuries reference Date: 10/14/2020 Site 2: Right breast coil shaped clip Using mammographic guidance, sterile technique, 1% lidocaine and an I-125 radioactive seed, coil shaped clip was localized using a cranial approach. The follow-up mammogram images confirm the seed in the expected location and were marked for Dr. Marlou Starks. Follow-up survey of the patient confirms presence of the radioactive seed. Order number of I-125 seed:  [id number]. Total activity:  5.465 millicuries reference Date: 10/14/2020 The patient tolerated the procedure well and was released from the [facility]. She was given instructions regarding seed removal. IMPRESSION: Radioactive seed localization right breast, 2 sites. No apparent complications. Electronically Signed   By: Lovey Newcomer M.D.   On: 10/27/2020 10:13  MM RT RADIO SEED EA ADD LESION LOC MAMMO  Result Date: 10/27/2020 CLINICAL DATA:  Patient for preoperative localization prior to right breast excision, 2 sites EXAM: MAMMOGRAPHIC GUIDED RADIOACTIVE SEED LOCALIZATION OF THE RIGHT BREAST COMPARISON:  Previous exam(s). FINDINGS: Patient presents for radioactive seed localization prior to right breast excision, 2 sites. I met with the patient and we discussed the procedure of seed localization including benefits and alternatives. We discussed the high likelihood of a successful procedure. We discussed the risks of the procedure including infection, bleeding, tissue injury and further surgery. We discussed the low dose of radioactivity involved in the procedure. Informed, written consent was given. The usual time-out  protocol was performed immediately prior to the procedure. Site 1: Right breast X shaped clip Using mammographic guidance, sterile technique, 1% lidocaine and an I-125 radioactive seed, X shaped clip was localized using a lateral approach. The follow-up mammogram images confirm the seed in the expected location and were marked for Dr. Marlou Starks. Follow-up survey of the patient confirms presence of the radioactive seed. Order number of I-125 seed:  [id number]. Total activity:  2.751 millicuries reference Date: 10/14/2020 Site 2: Right breast coil shaped clip Using mammographic guidance, sterile technique, 1% lidocaine and an I-125 radioactive seed, coil shaped clip was localized using a cranial approach. The follow-up mammogram images confirm the seed in the expected location and were marked for Dr. Marlou Starks. Follow-up survey of the patient confirms presence of the radioactive seed. Order number of I-125 seed:  [id number]. Total activity:  9.675 millicuries reference Date:  10/14/2020 The patient tolerated the procedure well and was released from the [facility]. She was given instructions regarding seed removal. IMPRESSION: Radioactive seed localization right breast, 2 sites. No apparent complications. Electronically Signed   By: Lovey Newcomer M.D.   On: 10/27/2020 10:13   All questions were answered. The patient knows to call the clinic with any problems, questions or concerns.  I have spent 30 minutes in the care of the patient, discussed pathology results, role of tamoxifen, role of annual MRI screening, lifetime risk of breast cancer and other lifestyle interventions.   Benay Pike, MD 11/18/2020 11:40 AM

## 2020-11-18 ENCOUNTER — Inpatient Hospital Stay: Payer: BC Managed Care – PPO | Attending: Hematology and Oncology | Admitting: Hematology and Oncology

## 2020-11-18 ENCOUNTER — Other Ambulatory Visit: Payer: Self-pay

## 2020-11-18 VITALS — BP 156/110 | Temp 98.3°F | Resp 18 | Wt 248.2 lb

## 2020-11-18 DIAGNOSIS — N6091 Unspecified benign mammary dysplasia of right breast: Secondary | ICD-10-CM | POA: Insufficient documentation

## 2020-11-18 DIAGNOSIS — Z9189 Other specified personal risk factors, not elsewhere classified: Secondary | ICD-10-CM | POA: Diagnosis not present

## 2020-12-01 ENCOUNTER — Other Ambulatory Visit: Payer: Self-pay

## 2020-12-01 ENCOUNTER — Ambulatory Visit: Payer: BC Managed Care – PPO | Admitting: Family

## 2020-12-01 VITALS — BP 142/90 | HR 60 | Temp 98.5°F | Ht 66.0 in | Wt 243.8 lb

## 2020-12-01 DIAGNOSIS — F419 Anxiety disorder, unspecified: Secondary | ICD-10-CM

## 2020-12-01 DIAGNOSIS — F32A Depression, unspecified: Secondary | ICD-10-CM

## 2020-12-01 DIAGNOSIS — Z13220 Encounter for screening for lipoid disorders: Secondary | ICD-10-CM | POA: Diagnosis not present

## 2020-12-01 DIAGNOSIS — I10 Essential (primary) hypertension: Secondary | ICD-10-CM | POA: Diagnosis not present

## 2020-12-01 DIAGNOSIS — R7309 Other abnormal glucose: Secondary | ICD-10-CM | POA: Diagnosis not present

## 2020-12-01 LAB — CBC WITH DIFFERENTIAL/PLATELET
Basophils Absolute: 0 10*3/uL (ref 0.0–0.1)
Basophils Relative: 0.3 % (ref 0.0–3.0)
Eosinophils Absolute: 0.3 10*3/uL (ref 0.0–0.7)
Eosinophils Relative: 7.7 % — ABNORMAL HIGH (ref 0.0–5.0)
HCT: 40.3 % (ref 36.0–46.0)
Hemoglobin: 13.7 g/dL (ref 12.0–15.0)
Lymphocytes Relative: 39.1 % (ref 12.0–46.0)
Lymphs Abs: 1.5 10*3/uL (ref 0.7–4.0)
MCHC: 34 g/dL (ref 30.0–36.0)
MCV: 92 fl (ref 78.0–100.0)
Monocytes Absolute: 0.4 10*3/uL (ref 0.1–1.0)
Monocytes Relative: 11.2 % (ref 3.0–12.0)
Neutro Abs: 1.6 10*3/uL (ref 1.4–7.7)
Neutrophils Relative %: 41.7 % — ABNORMAL LOW (ref 43.0–77.0)
Platelets: 216 10*3/uL (ref 150.0–400.0)
RBC: 4.38 Mil/uL (ref 3.87–5.11)
RDW: 13.1 % (ref 11.5–15.5)
WBC: 3.8 10*3/uL — ABNORMAL LOW (ref 4.0–10.5)

## 2020-12-01 LAB — HEMOGLOBIN A1C: Hgb A1c MFr Bld: 5.8 % (ref 4.6–6.5)

## 2020-12-01 LAB — LIPID PANEL
Cholesterol: 222 mg/dL — ABNORMAL HIGH (ref 0–200)
HDL: 41.7 mg/dL (ref >39.00)
LDL Cholesterol: 142 mg/dL — ABNORMAL HIGH (ref 0–99)
NonHDL: 180.13
Total CHOL/HDL Ratio: 5
Triglycerides: 191 mg/dL — ABNORMAL HIGH (ref 0.0–149.0)
VLDL: 38.2 mg/dL (ref 0.0–40.0)

## 2020-12-01 LAB — COMPREHENSIVE METABOLIC PANEL
ALT: 27 U/L (ref 0–35)
AST: 20 U/L (ref 0–37)
Albumin: 4.6 g/dL (ref 3.5–5.2)
Alkaline Phosphatase: 79 U/L (ref 39–117)
BUN: 13 mg/dL (ref 6–23)
CO2: 24 mEq/L (ref 19–32)
Calcium: 9.5 mg/dL (ref 8.4–10.5)
Chloride: 103 mEq/L (ref 96–112)
Creatinine, Ser: 0.82 mg/dL (ref 0.40–1.20)
GFR: 87.76 mL/min (ref >60.00)
Glucose, Bld: 95 mg/dL (ref 70–99)
Potassium: 3.9 mEq/L (ref 3.5–5.1)
Sodium: 137 mEq/L (ref 135–145)
Total Bilirubin: 0.6 mg/dL (ref 0.2–1.2)
Total Protein: 7.3 g/dL (ref 6.0–8.3)

## 2020-12-01 MED ORDER — LOSARTAN POTASSIUM-HCTZ 100-25 MG PO TABS: 1.0000 | ORAL_TABLET | Freq: Every day | ORAL | 1 refills | Status: DC

## 2020-12-01 NOTE — Progress Notes (Signed)
Tanya Raymond is a 45 y.o. female with the following history as recorded in EpicCare:  Patient Active Problem List   Diagnosis Date Noted   Genetic testing 09/18/2020   Family history of breast cancer 09/10/2020   FH: ovarian cancer 09/10/2020   Family history of prostate cancer 09/10/2020   Family history of colon cancer 09/10/2020    Current Outpatient Medications  Medication Sig Dispense Refill   Biotin 10000 MCG TABS Take by mouth.     escitalopram (LEXAPRO) 5 MG tablet TAKE 1 TABLET BY MOUTH EVERY DAY 90 tablet 0   levonorgestrel (MIRENA) 20 MCG/24HR IUD 1 each by Intrauterine route once.     loratadine (CLARITIN) 10 MG tablet Take 10 mg by mouth daily.     losartan-hydrochlorothiazide (HYZAAR) 100-25 MG tablet Take 1 tablet by mouth daily. 90 tablet 1   MAGNESIUM PO Take by mouth.     Multiple Vitamin (MULTIVITAMIN PO) Take by mouth.     VITAMIN D PO Take by mouth.     No current facility-administered medications for this visit.    Allergies: Patient has no known allergies.  Past Medical History:  Diagnosis Date   Anxiety    Family history of breast cancer 09/10/2020   Family history of colon cancer 09/10/2020   Family history of prostate cancer 09/10/2020   FH: ovarian cancer 09/10/2020   Hypertension     Past Surgical History:  Procedure Laterality Date   BREAST BIOPSY Left 03/19/2019    FIBROCYSTIC CHANGES WITH CALCIFICATIONS    BREAST LUMPECTOMY WITH RADIOACTIVE SEED LOCALIZATION Bilateral 10/28/2020   Procedure: BILATERAL BREAST LUMPECTOMY WITH RADIOACTIVE SEED LOCALIZATION x2 RIGHT AND X1 LEFT;  Surgeon: Jovita Kussmaul, MD;  Location: [facility];  Service: General;  Laterality: Bilateral;   OTHER SURGICAL HISTORY     IVF   WISDOM TOOTH EXTRACTION      Family History  Problem Relation Age of Onset   Hypertension Mother    Hypertension Father    Breast cancer Paternal Aunt 45   Prostate cancer Paternal Uncle 23       metastatic    Breast cancer Maternal Grandmother 61   Heart attack Maternal Grandfather    Heart attack Paternal Grandmother    Cancer Paternal Grandmother 83       unknown type; metastatic; ?colon   Heart attack Paternal Grandfather    Ovarian cancer Cousin 65       maternal cousin    Social History   Tobacco Use   Smoking status: Never   Smokeless tobacco: Never  Substance Use Topics   Alcohol use: Yes    Comment: Socially     Subjective:  Follow up on hypertension- home readings are consistent with what is seen today; would like to get fasting labs updated today; planning to work with nutritionist through her employer and counselor to help with stress management goals. Denies any chest pain, shortness of breath, blurred vision or headache  Would be interested in trying weight loss medication like Wegovy or Saxenda;      Objective:  Vitals:   12/01/20 0929  BP: (!) 142/90  Pulse: 60  Temp: 98.5 F (36.9 C)  TempSrc: Oral  SpO2: 99%  Weight: 243 lb 12.8 oz (110.6 kg)  Height: 5' 6"  (1.676 m)    General: Well developed, well nourished, in no acute distress  Skin : Warm and dry.  Head: Normocephalic and atraumatic  Eyes: Sclera and conjunctiva clear; pupils round  and reactive to light; extraocular movements intact  Ears: External normal; canals clear; tympanic membranes normal  Oropharynx: Pink, supple. No suspicious lesions  Neck: Supple without thyromegaly, adenopathy  Lungs: Respirations unlabored; clear to auscultation bilaterally without wheeze, rales, rhonchi  CVS exam: normal rate and regular rhythm.  Neurologic: Alert and oriented; speech intact; face symmetrical; moves all extremities well; CNII-XII intact without focal deficit   Assessment:  1. Primary hypertension   2. Elevated glucose   3. Lipid screening   4. Anxiety and depression     Plan:  D/C Losartan- change to Losartan HCT 100/25; follow up in 3 months or sooner if needed; Update labs- agree that trial of  Wegovy or Saxenda is appropriate; will know better how to proceed once labs are obtained. Check lipid panel; keep planned follow up with nutritionist; Prefers to stay on Lexapro at 5 mg for now; she will work with therapist as scheduled.  This visit occurred during the SARS-CoV-2 public health emergency.  Safety protocols were in place, including screening questions prior to the visit, additional usage of staff PPE, and extensive cleaning of exam room while observing appropriate contact time as indicated for disinfecting solutions.    No follow-ups on file.  Orders Placed This Encounter  Procedures   CBC with Differential/Platelet   Comp Met (CMET)   Lipid panel   Hemoglobin A1c    Requested Prescriptions   Signed Prescriptions Disp Refills   losartan-hydrochlorothiazide (HYZAAR) 100-25 MG tablet 90 tablet 1    Sig: Take 1 tablet by mouth daily.

## 2020-12-02 ENCOUNTER — Encounter: Payer: Self-pay | Admitting: Family

## 2020-12-02 ENCOUNTER — Other Ambulatory Visit: Payer: Self-pay | Admitting: Family

## 2020-12-02 MED ORDER — LOSARTAN POTASSIUM-HCTZ 100-25 MG PO TABS: 1.0000 | ORAL_TABLET | Freq: Every day | ORAL | 1 refills | Status: DC

## 2020-12-02 MED ORDER — WEGOVY 0.25 MG/0.5ML ~~LOC~~ SOAJ: 0.2500 mg | SUBCUTANEOUS | 0 refills | Status: DC

## 2020-12-08 ENCOUNTER — Other Ambulatory Visit: Payer: Self-pay | Admitting: Family

## 2020-12-08 MED ORDER — SAXENDA 18 MG/3ML ~~LOC~~ SOPN: PEN_INJECTOR | SUBCUTANEOUS | 0 refills | Status: DC

## 2020-12-08 NOTE — Telephone Encounter (Signed)
I will work on the PA once the pharmacy confirms that the PA is needed.

## 2020-12-08 NOTE — Telephone Encounter (Signed)
PA has been started for  [facility] (Key: [id number]) Rx #: [id number] Saxenda 18MG pen-injector

## 2021-01-05 ENCOUNTER — Other Ambulatory Visit: Payer: Self-pay | Admitting: Family

## 2021-01-06 ENCOUNTER — Other Ambulatory Visit: Payer: Self-pay | Admitting: Family

## 2021-01-07 ENCOUNTER — Encounter: Payer: Self-pay | Admitting: Hematology and Oncology

## 2021-01-15 ENCOUNTER — Other Ambulatory Visit: Payer: Self-pay

## 2021-01-15 ENCOUNTER — Ambulatory Visit (HOSPITAL_COMMUNITY)
Admission: RE | Admit: 2021-01-15 | Discharge: 2021-01-15 | Disposition: A | Discharge status: Home / Self Care | Payer: BC Managed Care – PPO | Source: Ambulatory Visit | Attending: Hematology and Oncology | Admitting: Hematology and Oncology

## 2021-01-15 DIAGNOSIS — Z9189 Other specified personal risk factors, not elsewhere classified: Secondary | ICD-10-CM | POA: Diagnosis present

## 2021-01-15 MED ORDER — GADOBUTROL 1 MMOL/ML IV SOLN
10.0000 mL | Freq: Once | INTRAVENOUS | Status: AC | PRN
  Administered 2021-01-15: 10 mL via INTRAVENOUS

## 2021-01-18 ENCOUNTER — Telehealth: Payer: Self-pay | Admitting: *Deleted

## 2021-01-18 NOTE — Telephone Encounter (Signed)
Per Dr.Iruku, called pt with message below. Advised to call if there are any other concerns. Pt verbalized understanding.

## 2021-01-18 NOTE — Telephone Encounter (Signed)
-----   Message from Rachel Moulds, MD sent at 01/18/2021 11:52 AM EDT ----- Please let the patient know that MRI looks good without any concerns for malignancy  Thanks,

## 2021-02-06 ENCOUNTER — Telehealth: Payer: BC Managed Care – PPO | Admitting: Nurse Practitioner

## 2021-02-06 DIAGNOSIS — R399 Unspecified symptoms and signs involving the genitourinary system: Secondary | ICD-10-CM

## 2021-02-06 MED ORDER — CEPHALEXIN 500 MG PO CAPS: 500.0000 mg | ORAL_CAPSULE | Freq: Two times a day (BID) | ORAL | 0 refills | Status: DC

## 2021-02-06 NOTE — Progress Notes (Signed)

## 2021-02-09 ENCOUNTER — Other Ambulatory Visit: Payer: Self-pay | Admitting: Family

## 2021-03-05 ENCOUNTER — Ambulatory Visit: Payer: BC Managed Care – PPO | Admitting: Family

## 2021-03-05 ENCOUNTER — Telehealth: Payer: Self-pay

## 2021-03-05 ENCOUNTER — Encounter: Payer: Self-pay | Admitting: Family

## 2021-03-05 VITALS — BP 128/80 | HR 73 | Temp 98.4°F | Ht 66.0 in | Wt 219.8 lb

## 2021-03-05 DIAGNOSIS — Z6835 Body mass index (BMI) 35.0-35.9, adult: Secondary | ICD-10-CM

## 2021-03-05 DIAGNOSIS — I10 Essential (primary) hypertension: Secondary | ICD-10-CM | POA: Diagnosis not present

## 2021-03-05 DIAGNOSIS — R7303 Prediabetes: Secondary | ICD-10-CM | POA: Diagnosis not present

## 2021-03-05 LAB — COMPREHENSIVE METABOLIC PANEL
ALT: 24 U/L (ref 0–35)
AST: 18 U/L (ref 0–37)
Albumin: 4.6 g/dL (ref 3.5–5.2)
Alkaline Phosphatase: 79 U/L (ref 39–117)
BUN: 16 mg/dL (ref 6–23)
CO2: 29 mEq/L (ref 19–32)
Calcium: 9.5 mg/dL (ref 8.4–10.5)
Chloride: 100 mEq/L (ref 96–112)
Creatinine, Ser: 0.98 mg/dL (ref 0.40–1.20)
GFR: 70.73 mL/min (ref >60.00)
Glucose, Bld: 90 mg/dL (ref 70–99)
Potassium: 3.6 mEq/L (ref 3.5–5.1)
Sodium: 137 mEq/L (ref 135–145)
Total Bilirubin: 0.5 mg/dL (ref 0.2–1.2)
Total Protein: 7.3 g/dL (ref 6.0–8.3)

## 2021-03-05 LAB — HEMOGLOBIN A1C: Hgb A1c MFr Bld: 5.7 % (ref 4.6–6.5)

## 2021-03-05 MED ORDER — WEGOVY 1.7 MG/0.75ML ~~LOC~~ SOAJ: 1.7000 mg | SUBCUTANEOUS | 1 refills | Status: DC

## 2021-03-05 NOTE — Progress Notes (Signed)
Tanya Raymond is a 45 y.o. female with the following history as recorded in EpicCare:  Patient Active Problem List   Diagnosis Date Noted   Genetic testing 09/18/2020   Family history of breast cancer 09/10/2020   FH: ovarian cancer 09/10/2020   Family history of prostate cancer 09/10/2020   Family history of colon cancer 09/10/2020    Current Outpatient Medications  Medication Sig Dispense Refill   Biotin 10000 MCG TABS Take by mouth.     escitalopram (LEXAPRO) 5 MG tablet TAKE 1 TABLET BY MOUTH EVERY DAY 90 tablet 0   levonorgestrel (MIRENA) 20 MCG/24HR IUD 1 each by Intrauterine route once.     loratadine (CLARITIN) 10 MG tablet Take 10 mg by mouth daily.     losartan-hydrochlorothiazide (HYZAAR) 100-25 MG tablet Take 1 tablet by mouth daily. 90 tablet 1   MAGNESIUM PO Take by mouth.     Multiple Vitamin (MULTIVITAMIN PO) Take by mouth.     SAXENDA 18 MG/3ML SOPN INJECT 0.6 MG DAILY X 1 WEEK AS DIRECTED INCREASE BY 0.6 MG WEEKLY UNTIL MAINTENANCE DOSAGE OF 3 MG DAILY ACHIEVED. 15 mL 0   Semaglutide-Weight Management (WEGOVY) 1.7 MG/0.75ML SOAJ Inject 1.7 mg into the skin once a week. 3 mL 1   VITAMIN D PO Take by mouth.     No current facility-administered medications for this visit.    Allergies: Patient has no known allergies.  Past Medical History:  Diagnosis Date   Anxiety    Family history of breast cancer 09/10/2020   Family history of colon cancer 09/10/2020   Family history of prostate cancer 09/10/2020   FH: ovarian cancer 09/10/2020   Hypertension     Past Surgical History:  Procedure Laterality Date   BREAST BIOPSY Left 03/19/2019    FIBROCYSTIC CHANGES WITH CALCIFICATIONS    BREAST LUMPECTOMY WITH RADIOACTIVE SEED LOCALIZATION Bilateral 10/28/2020   Procedure: BILATERAL BREAST LUMPECTOMY WITH RADIOACTIVE SEED LOCALIZATION x2 RIGHT AND X1 LEFT;  Surgeon: Autumn Messing III, MD;  Location: [place];  Service: General;  Laterality: Bilateral;    OTHER SURGICAL HISTORY     IVF   WISDOM TOOTH EXTRACTION      Family History  Problem Relation Age of Onset   Hypertension Mother    Hypertension Father    Breast cancer Paternal Aunt 83   Prostate cancer Paternal Uncle 68       metastatic   Breast cancer Maternal Grandmother 12   Heart attack Maternal Grandfather    Heart attack Paternal Grandmother    Cancer Paternal Grandmother 32       unknown type; metastatic; ?colon   Heart attack Paternal Grandfather    Ovarian cancer Cousin 71       maternal cousin    Social History   Tobacco Use   Smoking status: Never   Smokeless tobacco: Never  Substance Use Topics   Alcohol use: Yes    Comment: Socially     Subjective:  3 month follow up on hypertension/ obesity; has lost 20+ pounds since starting Saxenda; Denies any chest pain, shortness of breath, blurred vision or headache. Would like to try changing to weekly Wegovy if possible;     Objective:  Vitals:   03/05/21 0828  BP: 128/80  Pulse: 73  Temp: 98.4 F (36.9 C)  TempSrc: Oral  SpO2: 98%  Weight: 219 lb 12.8 oz (99.7 kg)  Height: 5' 6"  (1.676 m)    General: Well developed, well nourished,  in no acute distress  Skin : Warm and dry.  Head: Normocephalic and atraumatic  Lungs: Respirations unlabored; clear to auscultation bilaterally without wheeze, rales, rhonchi  CVS exam: normal rate and regular rhythm.  Neurologic: Alert and oriented; speech intact; face symmetrical; moves all extremities well; CNII-XII intact without focal deficit   Assessment:  1. Pre-diabetes   2. Primary hypertension   3. Class 2 severe obesity with serious comorbidity and body mass index (BMI) of 35.0 to 35.9 in adult, unspecified obesity type [facility])     Plan:  Update labs today; Stable; will need to consider lowering the dosage of the medication as weight loss continues. Congratulated patient on weight loss; try changing from [name] to [facility] weekly; follow up in 3-4 months;    This visit occurred during the SARS-CoV-2 public health emergency.  Safety protocols were in place, including screening questions prior to the visit, additional usage of staff PPE, and extensive cleaning of exam room while observing appropriate contact time as indicated for disinfecting solutions.    No follow-ups on file.  Orders Placed This Encounter  Procedures   Comp Met (CMET)   Hemoglobin A1c    Requested Prescriptions   Signed Prescriptions Disp Refills   Semaglutide-Weight Management (WEGOVY) 1.7 MG/0.75ML SOAJ 3 mL 1    Sig: Inject 1.7 mg into the skin once a week.

## 2021-03-05 NOTE — Telephone Encounter (Signed)
[facility] (Key: [id number]) Rx #: [id number] HWEXHB 1.7MG /0.75ML auto-injectors  Reginal Lutes has been approved!

## 2021-03-07 ENCOUNTER — Other Ambulatory Visit: Payer: Self-pay | Admitting: Family

## 2021-03-09 ENCOUNTER — Other Ambulatory Visit: Payer: Self-pay | Admitting: Family

## 2021-04-09 ENCOUNTER — Other Ambulatory Visit: Payer: Self-pay

## 2021-04-12 ENCOUNTER — Other Ambulatory Visit: Payer: Self-pay

## 2021-04-12 ENCOUNTER — Encounter: Payer: Self-pay | Admitting: Hematology and Oncology

## 2021-04-12 ENCOUNTER — Inpatient Hospital Stay: Payer: BC Managed Care – PPO | Attending: Hematology and Oncology | Admitting: Hematology and Oncology

## 2021-04-12 ENCOUNTER — Encounter: Payer: Self-pay | Admitting: Family

## 2021-04-12 VITALS — BP 135/93 | HR 69 | Temp 97.8°F | Resp 17 | Wt 218.8 lb

## 2021-04-12 DIAGNOSIS — Z9189 Other specified personal risk factors, not elsewhere classified: Secondary | ICD-10-CM | POA: Diagnosis not present

## 2021-04-12 DIAGNOSIS — N6091 Unspecified benign mammary dysplasia of right breast: Secondary | ICD-10-CM | POA: Insufficient documentation

## 2021-04-12 MED ORDER — WEGOVY 1.7 MG/0.75ML ~~LOC~~ SOAJ: 1.7000 mg | SUBCUTANEOUS | 1 refills | Status: DC

## 2021-04-12 NOTE — Progress Notes (Signed)
[facility] CONSULT NOTE  Patient Care Team: Olive Bass, FNP as PCP - General (Internal Medicine)  CHIEF COMPLAINTS/PURPOSE OF CONSULTATION:  Atypical ductal hyperplasia.  ASSESSMENT & PLAN:   No problem-specific Assessment & Plan notes found for this encounter.   No orders of the defined types were placed in this encounter.   This is a very pleasant 46 year old female patient who was initially seen by atypical ductal hyperplasia back in June and high risk breast clinic who is here for follow-up after bilateral lumpectomy.  During her last visit we have discussed about difference between ADH, DCIS and invasive breast cancer and we have discussed the risks and benefits of tamoxifen therapy.  However at that time she was not sure if she wanted to proceed with a lumpectomy versus mastectomy hence we have discussed about following up after surgery and she is here for additional recommendations. 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 During her last visit, we have discussed about considering tamoxifen but she was not ready to proceed with this.  She was however interested in continuing MRI and mammogram screening.  She had her MRI in October 2022 which was unremarkable.  No concerning review of systems today.  Physical examination today unremarkable, no concerning breast changes.  She will proceed with mammogram in March 2023, this has been ordered and she will return to clinic in about 6 months.  She was instructed to call us with any new questions or concerns.  Thank you for consulting [name] in the care of this patient.  Please do not hesitate to contact us with any additional questions or concerns.  HISTORY OF PRESENTING ILLNESS:  Tanya Raymond 46 y.o. female is here because of ADH   Tanya Raymond is a very pleasant 46 yr old female patient with  PMH significant for HTN, depression referred to hematology for abnormal mammogram.  Screening mammogram 06/09/2020 IMPRESSION: Further evaluation is suggested for possible mass with calcifications in the right breast.   Further evaluation is suggested for possible mass with calcifications in the left breast.  07/01/20  Diagnostic mammogram : Indeterminate right breast calcifications forming 2-3 distinct groups in the upper outer quadrant. Recommendation is for stereotactic biopsy of the largest group of calcifications. If these demonstrate benign pathology, additional six-month follow-up is recommended for the additional calcifications. 2. Probably benign left breast calcifications. If above pathology demonstrates benign findings, additional six-month follow-up is recommended.  07/03/2020  Right breast needle core biopsy showed ADH  07/15/2020  Other biopsies showed atypical apocrine hyperplasia and fibrocystic changes.  She had right and left breast lumpectomy on 10/28/2020 by Dr Carolynne Edouard. Pathology showed fibrocystic change, extensive biopsy site changes, no atypia or malignancy in left breast. Right breast showed fibrocystic change with apocrine metaplasia, complex sclerosing lesion, pseudoangiomatous stromal hyperplasia, extensive biopsy site change. No atypia or malignancy.  Interval history She is here for FU, healing well.  No breast issues.  She has been self breast examination, did not notice any changes.  She had an MRI in October 2022.  She wanted to wait on the tamoxifen prophylaxis.  No other complaints today.  She is vaccinated for flu and had her COVID booster since her last visit.  MEDICAL HISTORY:  Past Medical History:  Diagnosis Date   Anxiety    Family history of breast cancer 09/10/2020   Family history of colon cancer 09/10/2020  Family history of prostate cancer 09/10/2020   FH: ovarian cancer 09/10/2020   Hypertension     SURGICAL HISTORY: Past Surgical  History:  Procedure Laterality Date   BREAST BIOPSY Left 03/19/2019    FIBROCYSTIC CHANGES WITH CALCIFICATIONS    BREAST LUMPECTOMY WITH RADIOACTIVE SEED LOCALIZATION Bilateral 10/28/2020   Procedure: BILATERAL BREAST LUMPECTOMY WITH RADIOACTIVE SEED LOCALIZATION x2 RIGHT AND X1 LEFT;  Surgeon: Griselda Miner, MD;  Location: [facility];  Service: General;  Laterality: Bilateral;   OTHER SURGICAL HISTORY     IVF   WISDOM TOOTH EXTRACTION      SOCIAL HISTORY: Social History   Socioeconomic History   Marital status: Married    Spouse name: Not on file   Number of children: Not on file   Years of education: Not on file   Highest education level: Not on file  Occupational History   Not on file  Tobacco Use   Smoking status: Never   Smokeless tobacco: Never  Vaping Use   Vaping Use: Never used  Substance and Sexual Activity   Alcohol use: Yes    Comment: Socially    Drug use: Not Currently   Sexual activity: Yes    Birth control/protection: I.U.D.  Other Topics Concern   Not on file  Social History Narrative   Not on file   Social Determinants of Health   Financial Resource Strain: Not on file  Food Insecurity: Not on file  Transportation Needs: Not on file  Physical Activity: Not on file  Stress: Not on file  Social Connections: Not on file  Intimate Partner Violence: Not on file    FAMILY HISTORY: Family History  Problem Relation Age of Onset   Hypertension Mother    Hypertension Father    Breast cancer Paternal Aunt 47   Prostate cancer Paternal Uncle 11       metastatic   Breast cancer Maternal Grandmother 79   Heart attack Maternal Grandfather    Heart attack Paternal Grandmother    Cancer Paternal Grandmother 37       unknown type; metastatic; ?colon   Heart attack Paternal Grandfather    Ovarian cancer Cousin 63       maternal cousin    ALLERGIES:  has No Known Allergies.  MEDICATIONS:  Current Outpatient Medications  Medication  Sig Dispense Refill   escitalopram (LEXAPRO) 5 MG tablet TAKE 1 TABLET BY MOUTH EVERY DAY 90 tablet 1   Biotin 32671 MCG TABS Take by mouth.     levonorgestrel (MIRENA) 20 MCG/24HR IUD 1 each by Intrauterine route once.     loratadine (CLARITIN) 10 MG tablet Take 10 mg by mouth daily.     losartan-hydrochlorothiazide (HYZAAR) 100-25 MG tablet Take 1 tablet by mouth daily. 90 tablet 1   MAGNESIUM PO Take by mouth.     Multiple Vitamin (MULTIVITAMIN PO) Take by mouth.     Semaglutide-Weight Management (WEGOVY) 1.7 MG/0.75ML SOAJ Inject 1.7 mg into the skin once a week. 3 mL 1   VITAMIN D PO Take by mouth.     No current facility-administered medications for this visit.     PHYSICAL EXAMINATION:  ECOG PERFORMANCE STATUS: 0 - Asymptomatic  Vitals:   04/12/21 0911  BP: (!) 135/93  Pulse: 69  Resp: 17  Temp: 97.8 F (36.6 C)  SpO2: 100%    Filed Weights   04/12/21 0911  Weight: 218 lb 12.8 oz (99.2 kg)    Physical Exam Constitutional:  Appearance: Normal appearance.  HENT:     Head: Normocephalic and atraumatic.  Cardiovascular:     Rate and Rhythm: Regular rhythm. Tachycardia present.     Pulses: Normal pulses.     Heart sounds: Normal heart sounds.  Pulmonary:     Effort: Pulmonary effort is normal.     Breath sounds: Normal breath sounds.  Abdominal:     General: Abdomen is flat. Bowel sounds are normal.     Palpations: Abdomen is soft.  Musculoskeletal:     Cervical back: Normal range of motion and neck supple. No rigidity.  Lymphadenopathy:     Cervical: No cervical adenopathy.  Skin:    General: Skin is warm and dry.  Neurological:     General: No focal deficit present.     Mental Status: She is alert and oriented to person, place, and time.    LABORATORY DATA:  I have reviewed the data as listed Lab Results  Component Value Date   WBC 3.8 (L) 12/01/2020   HGB 13.7 12/01/2020   HCT 40.3 12/01/2020   MCV 92.0 12/01/2020   PLT 216.0 12/01/2020      Chemistry      Component Value Date/Time   NA 137 03/05/2021 0859   K 3.6 03/05/2021 0859   CL 100 03/05/2021 0859   CO2 29 03/05/2021 0859   BUN 16 03/05/2021 0859   CREATININE 0.98 03/05/2021 0859      Component Value Date/Time   CALCIUM 9.5 03/05/2021 0859   ALKPHOS 79 03/05/2021 0859   AST 18 03/05/2021 0859   ALT 24 03/05/2021 0859   BILITOT 0.5 03/05/2021 0859      MRI without any evidence of malignancy.  Routine annual screening with mammography and breast MRI recommended.  Patient is due for her next screening mammogram in March 2023.  RADIOGRAPHIC STUDIES:  I have personally reviewed the radiological images as listed and agreed with the findings in the report. No results found.  All questions were answered. The patient knows to call the clinic with any problems, questions or concerns.  I have spent 20 minutes in the care of the patient, discussed pathology results, role of tamoxifen, role of annual MRI screening, lifetime risk of breast cancer and other lifestyle interventions.   Tanya MouldsPraveena Dwayne Bulkley, MD 04/12/2021 9:26 AM

## 2021-05-04 ENCOUNTER — Encounter: Payer: Self-pay | Admitting: Family

## 2021-05-04 ENCOUNTER — Other Ambulatory Visit: Payer: Self-pay | Admitting: Family

## 2021-05-04 MED ORDER — WEGOVY 2.4 MG/0.75ML ~~LOC~~ SOAJ: 2.4000 mg | SUBCUTANEOUS | 1 refills | Status: DC

## 2021-05-10 ENCOUNTER — Other Ambulatory Visit: Payer: Self-pay | Admitting: Hematology and Oncology

## 2021-05-10 DIAGNOSIS — Z1231 Encounter for screening mammogram for malignant neoplasm of breast: Secondary | ICD-10-CM

## 2021-05-25 ENCOUNTER — Encounter: Payer: Self-pay | Admitting: Family

## 2021-05-25 ENCOUNTER — Other Ambulatory Visit: Payer: Self-pay | Admitting: Family

## 2021-05-25 MED ORDER — ESCITALOPRAM OXALATE 10 MG PO TABS: 10.0000 mg | ORAL_TABLET | Freq: Every day | ORAL | 0 refills | Status: DC

## 2021-06-10 ENCOUNTER — Ambulatory Visit
Admission: RE | Admit: 2021-06-10 | Discharge: 2021-06-10 | Disposition: A | Discharge status: Home / Self Care | Payer: BC Managed Care – PPO | Source: Ambulatory Visit | Attending: Hematology and Oncology | Admitting: Hematology and Oncology

## 2021-06-10 DIAGNOSIS — Z1231 Encounter for screening mammogram for malignant neoplasm of breast: Secondary | ICD-10-CM

## 2021-06-11 ENCOUNTER — Other Ambulatory Visit: Payer: Self-pay | Admitting: Hematology and Oncology

## 2021-06-11 DIAGNOSIS — R928 Other abnormal and inconclusive findings on diagnostic imaging of breast: Secondary | ICD-10-CM

## 2021-06-16 ENCOUNTER — Telehealth: Payer: Self-pay | Admitting: Hematology and Oncology

## 2021-06-16 NOTE — Telephone Encounter (Signed)
.  Called pt per 3/15 staff message , Patient was unavailable, a message with appt time and date was left with number on file.  ? ?

## 2021-06-29 ENCOUNTER — Ambulatory Visit
Admission: RE | Admit: 2021-06-29 | Discharge: 2021-06-29 | Disposition: A | Discharge status: Home / Self Care | Payer: BC Managed Care – PPO | Source: Ambulatory Visit | Attending: Hematology and Oncology | Admitting: Hematology and Oncology

## 2021-06-29 ENCOUNTER — Other Ambulatory Visit: Payer: Self-pay | Admitting: Hematology and Oncology

## 2021-06-29 DIAGNOSIS — R928 Other abnormal and inconclusive findings on diagnostic imaging of breast: Secondary | ICD-10-CM

## 2021-06-29 DIAGNOSIS — N6489 Other specified disorders of breast: Secondary | ICD-10-CM

## 2021-07-12 ENCOUNTER — Other Ambulatory Visit: Payer: Self-pay | Admitting: Family

## 2021-07-13 ENCOUNTER — Ambulatory Visit
Admission: RE | Admit: 2021-07-13 | Discharge: 2021-07-13 | Disposition: A | Discharge status: Home / Self Care | Payer: BC Managed Care – PPO | Source: Ambulatory Visit | Attending: Hematology and Oncology | Admitting: Hematology and Oncology

## 2021-07-13 DIAGNOSIS — N6489 Other specified disorders of breast: Secondary | ICD-10-CM

## 2021-07-14 ENCOUNTER — Encounter (HOSPITAL_COMMUNITY): Payer: Self-pay

## 2021-07-15 ENCOUNTER — Other Ambulatory Visit: Payer: Self-pay

## 2021-07-15 ENCOUNTER — Inpatient Hospital Stay: Payer: BC Managed Care – PPO | Attending: Hematology and Oncology | Admitting: Hematology and Oncology

## 2021-07-15 VITALS — BP 114/87 | HR 87 | Temp 97.3°F | Resp 17 | Ht 66.0 in | Wt 216.2 lb

## 2021-07-15 DIAGNOSIS — Z9189 Other specified personal risk factors, not elsewhere classified: Secondary | ICD-10-CM | POA: Diagnosis not present

## 2021-07-15 DIAGNOSIS — N6091 Unspecified benign mammary dysplasia of right breast: Secondary | ICD-10-CM | POA: Insufficient documentation

## 2021-07-15 NOTE — Progress Notes (Addendum)
[facility] ?CONSULT NOTE ? ?Patient Care Team: ?Olive Bass, FNP as PCP - General (Internal Medicine) ? ?CHIEF COMPLAINTS/PURPOSE OF CONSULTATION:  ?Atypical ductal hyperplasia. ? ?ASSESSMENT & PLAN:  ? ?No problem-specific Assessment & Plan notes found for this encounter. ? ? ?No orders of the defined types were placed in this encounter. ? ? ?This is a very pleasant 46 year old female patient who was initially seen by atypical ductal hyperplasia back in June and high risk breast clinic who is here for follow-up after bilateral lumpectomy.  ? ?Since last visit, she had left breast mass noted on the mammogram and underwent another procedure. 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 No malignancy. She says she is tired of these findings, every time she does a mammogram, she has abnormal findings which lead to biopsy and surgery. ?She was wondering if she can skip MRI this year. She understands that MRI's can be useful but she is tired of having more surgeries and procedures at this time. ?She is considering bilateral mastectomy to avoid this. ?I sent an in basket message to Dr Carolynne Edouard ?She wanted to skip breast exam today ?RTC in 6 months ? ?Thank you for consulting [name] in the care of this patient.  Please do not hesitate to contact us with any additional questions or concerns. ? ?HISTORY OF PRESENTING ILLNESS:  ?Tanya Raymond 46 y.o. female is here because of ADH  ? ?Tanya Raymond is a very pleasant 46 yr old female patient with PMH significant for HTN, depression referred to hematology for abnormal mammogram. ? ?Screening mammogram 06/09/2020 ?IMPRESSION: ?Further evaluation is suggested for possible mass with ?calcifications in the right breast. ?  ?Further evaluation is suggested for possible mass with ?calcifications in the left breast. ? ?07/01/20 ? ?Diagnostic mammogram : Indeterminate right breast calcifications forming 2-3 distinct ?groups in the upper outer  quadrant. Recommendation is for ?stereotactic biopsy of the largest group of calcifications. If these ?demonstrate benign pathology, additional six-month follow-up is ?recommended for the additional calcifications. ?2. Probably benign left breast calcifications. If above pathology ?demonstrates benign findings, additional six-month follow-up is ?recommended. ? ?07/03/2020 ? ?Right breast needle core biopsy showed ADH ? ?07/15/2020 ? ?Other biopsies showed atypical apocrine hyperplasia and fibrocystic changes. ? ?She had right and left breast lumpectomy on 10/28/2020 by Dr Carolynne Edouard. ?Pathology showed fibrocystic change, extensive biopsy site changes, no atypia or malignancy in left breast. ?Right breast showed fibrocystic change with apocrine metaplasia, complex sclerosing lesion, pseudoangiomatous stromal hyperplasia, extensive biopsy site change. No atypia or malignancy. ? ?She had another abnormal mammogram on 06/10/2021 which showed left breast mass. ?Pathology showed fibrocystic changes, cystic apocrine metaplasia. ? ? ? ?Interval history ? ?She is here for follow up. She says she has healed well from recent surgery. She is tired of these procedures she says. She is almost considering bilateral mastectomy. ?She wants to meet the surgeon and discuss about it. ? ?MEDICAL HISTORY:  ?Past Medical History:  ?Diagnosis Date  ? Anxiety   ? Family history of breast cancer 09/10/2020  ? Family history of colon cancer 09/10/2020  ? Family history of prostate cancer 09/10/2020  ? FH: ovarian cancer 09/10/2020  ? Hypertension   ? ? ?SURGICAL HISTORY: ?Past Surgical History:  ?Procedure Laterality Date  ? BREAST BIOPSY Left 03/19/2019  ?  FIBROCYSTIC CHANGES WITH CALCIFICATIONS   ? BREAST LUMPECTOMY WITH RADIOACTIVE SEED LOCALIZATION Bilateral 10/28/2020  ? Procedure: BILATERAL BREAST LUMPECTOMY WITH RADIOACTIVE SEED LOCALIZATION x2 RIGHT AND X1 LEFT;  Surgeon: Griselda Mineroth, Paul III, MD;  Location: [facility];  Service:  General;  Laterality: Bilateral;  ? OTHER SURGICAL HISTORY    ? IVF  ? WISDOM TOOTH EXTRACTION    ? ? ?SOCIAL HISTORY: ?Social History  ? ?Socioeconomic History  ? Marital status: Married  ?  Spouse name: Not on file  ? Number of children: Not on file  ? Years of education: Not on file  ? Highest education level: Not on file  ?Occupational History  ? Not on file  ?Tobacco Use  ? Smoking status: Never  ? Smokeless tobacco: Never  ?Vaping Use  ? Vaping Use: Never used  ?Substance and Sexual Activity  ? Alcohol use: Yes  ?  Comment: Socially   ? Drug use: Not Currently  ? Sexual activity: Yes  ?  Birth control/protection: I.U.D.  ?Other Topics Concern  ? Not on file  ?Social History Narrative  ? Not on file  ? ?Social Determinants of Health  ? ?Financial Resource Strain: Not on file  ?Food Insecurity: Not on file  ?Transportation Needs: Not on file  ?Physical Activity: Not on file  ?Stress: Not on file  ?Social Connections: Not on file  ?Intimate Partner Violence: Not on file  ? ? ?FAMILY HISTORY: ?Family History  ?Problem Relation Age of Onset  ? Hypertension Mother   ? Hypertension Father   ? Breast cancer Paternal Aunt [id number]  ? Prostate cancer Paternal Uncle 6475  ?     metastatic  ? Breast cancer Maternal Grandmother 5670  ? Heart attack Maternal Grandfather   ? Heart attack Paternal Grandmother   ? Cancer Paternal Grandmother [id number]  ?     unknown type; metastatic; ?colon  ? Heart attack Paternal Grandfather   ? Ovarian cancer Cousin 6450  ?     maternal cousin  ? ? ?ALLERGIES:  has No Known Allergies. ? ?MEDICATIONS:  ?Current Outpatient Medications  ?Medication Sig Dispense Refill  ? Biotin 1610910000 MCG TABS Take by mouth.    ? escitalopram (LEXAPRO) 10 MG tablet Take 1 tablet (10 mg total) by mouth daily. 90 tablet 0  ? levonorgestrel (MIRENA) 20 MCG/24HR IUD 1 each by Intrauterine route once.    ? loratadine (CLARITIN) 10 MG tablet Take 10 mg by mouth daily.    ? losartan-hydrochlorothiazide (HYZAAR) 100-25 MG tablet Take 1  tablet by mouth daily. 90 tablet 1  ? MAGNESIUM PO Take by mouth.    ? Multiple Vitamin (MULTIVITAMIN PO) Take by mouth.    ? VITAMIN D PO Take by mouth.    ? WEGOVY 2.4 MG/0.75ML SOAJ INJECT 2.4 MG INTO THE SKIN ONCE A WEEK. 3 mL 0  ? ?No current facility-administered medications for this visit.  ? ? ? ?PHYSICAL EXAMINATION: ? ?ECOG PERFORMANCE STATUS: 0 - Asymptomatic ? ?Vitals:  ? 07/15/21 1546  ?BP: 114/87  ?Pulse: 87  ?Resp: 17  ?Temp: (!) 97.3 ?F (36.3 ?C)  ?SpO2: 99%  ? ? ?Filed Weights  ? 07/15/21 1546  ?Weight: 216 lb 3.2 oz (98.1 kg)  ? ? ?Physical Exam ?Constitutional:   ?   Appearance: Normal appearance.  ?HENT:  ?   Head: Normocephalic and atraumatic.  ?Cardiovascular:  ?   Rate and Rhythm: Regular rhythm.  ?   Pulses: Normal pulses.  ?   Heart sounds: Normal heart sounds.  ?Pulmonary:  ?   Effort: Pulmonary effort is normal.  ?   Breath sounds: Normal breath sounds.  ?Abdominal:  ?  General: Abdomen is flat. Bowel sounds are normal.  ?   Palpations: Abdomen is soft.  ?Musculoskeletal:  ?   Cervical back: Normal range of motion and neck supple. No rigidity.  ?Lymphadenopathy:  ?   Cervical: No cervical adenopathy.  ?Skin: ?   General: Skin is warm and dry.  ?Neurological:  ?   General: No focal deficit present.  ?   Mental Status: She is alert and oriented to person, place, and time.  ? ? ?LABORATORY DATA:  ?I have reviewed the data as listed ?Lab Results  ?Component Value Date  ? WBC 3.8 (L) 12/01/2020  ? HGB 13.7 12/01/2020  ? HCT 40.3 12/01/2020  ? MCV 92.0 12/01/2020  ? PLT 216.0 12/01/2020  ? ?  Chemistry   ?   ?Component Value Date/Time  ? NA 137 03/05/2021 0859  ? K 3.6 03/05/2021 0859  ? CL 100 03/05/2021 0859  ? CO2 29 03/05/2021 0859  ? BUN 16 03/05/2021 0859  ? CREATININE 0.98 03/05/2021 0859  ?    ?Component Value Date/Time  ? CALCIUM 9.5 03/05/2021 0859  ? ALKPHOS 79 03/05/2021 0859  ? AST 18 03/05/2021 0859  ? ALT 24 03/05/2021 0859  ? BILITOT 0.5 03/05/2021 0859  ?  ? ? ?MRI without any  evidence of malignancy.  Routine annual screening with mammography and breast MRI recommended.  Patient is due for her next screening mammogram in March 2023. ? ?RADIOGRAPHIC STUDIES: ? ?I have personally review

## 2021-07-16 ENCOUNTER — Encounter: Payer: Self-pay | Admitting: Hematology and Oncology

## 2021-07-23 ENCOUNTER — Encounter: Payer: Self-pay | Admitting: Family

## 2021-07-23 ENCOUNTER — Ambulatory Visit (INDEPENDENT_AMBULATORY_CARE_PROVIDER_SITE_OTHER): Payer: BC Managed Care – PPO | Admitting: Family

## 2021-07-23 VITALS — BP 110/80 | HR 86 | Temp 98.8°F | Ht 66.0 in | Wt 216.8 lb

## 2021-07-23 DIAGNOSIS — F32A Depression, unspecified: Secondary | ICD-10-CM | POA: Diagnosis not present

## 2021-07-23 DIAGNOSIS — F419 Anxiety disorder, unspecified: Secondary | ICD-10-CM | POA: Diagnosis not present

## 2021-07-23 DIAGNOSIS — I10 Essential (primary) hypertension: Secondary | ICD-10-CM

## 2021-07-23 DIAGNOSIS — R7309 Other abnormal glucose: Secondary | ICD-10-CM | POA: Diagnosis not present

## 2021-07-23 MED ORDER — ESCITALOPRAM OXALATE 10 MG PO TABS
10.0000 mg | ORAL_TABLET | Freq: Every day | ORAL | 3 refills | Status: DC
Start: 2021-07-23 — End: 2022-05-09

## 2021-07-23 MED ORDER — WEGOVY 2.4 MG/0.75ML ~~LOC~~ SOAJ: 2.4000 mg | SUBCUTANEOUS | 2 refills | Status: DC

## 2021-07-23 MED ORDER — LOSARTAN POTASSIUM-HCTZ 100-25 MG PO TABS: 1.0000 | ORAL_TABLET | Freq: Every day | ORAL | 1 refills | Status: DC

## 2021-07-23 NOTE — Progress Notes (Signed)
?Tanya Raymond is a 46 y.o. female with the following history as recorded in EpicCare:  ?Patient Active Problem List  ? Diagnosis Date Noted  ? Genetic testing 09/18/2020  ? Family history of breast cancer 09/10/2020  ? FH: ovarian cancer 09/10/2020  ? Family history of prostate cancer 09/10/2020  ? Family history of colon cancer 09/10/2020  ?  ?Current Outpatient Medications  ?Medication Sig Dispense Refill  ? Biotin 10000 MCG TABS Take by mouth.    ? levonorgestrel (MIRENA) 20 MCG/24HR IUD 1 each by Intrauterine route once.    ? loratadine (CLARITIN) 10 MG tablet Take 10 mg by mouth daily.    ? MAGNESIUM PO Take by mouth.    ? Multiple Vitamin (MULTIVITAMIN PO) Take by mouth.    ? VITAMIN D PO Take by mouth.    ? escitalopram (LEXAPRO) 10 MG tablet Take 1 tablet (10 mg total) by mouth daily. 90 tablet 3  ? losartan-hydrochlorothiazide (HYZAAR) 100-25 MG tablet Take 1 tablet by mouth daily. 90 tablet 1  ? Semaglutide-Weight Management (WEGOVY) 2.4 MG/0.75ML SOAJ Inject 2.4 mg into the skin once a week. 3 mL 2  ? ?No current facility-administered medications for this visit.  ?  ?Allergies: Patient has no known allergies.  ?Past Medical History:  ?Diagnosis Date  ? Anxiety   ? Family history of breast cancer 09/10/2020  ? Family history of colon cancer 09/10/2020  ? Family history of prostate cancer 09/10/2020  ? FH: ovarian cancer 09/10/2020  ? Hypertension   ?  ?Past Surgical History:  ?Procedure Laterality Date  ? BREAST BIOPSY Left 03/19/2019  ?  FIBROCYSTIC CHANGES WITH CALCIFICATIONS   ? BREAST LUMPECTOMY WITH RADIOACTIVE SEED LOCALIZATION Bilateral 10/28/2020  ? Procedure: BILATERAL BREAST LUMPECTOMY WITH RADIOACTIVE SEED LOCALIZATION x2 RIGHT AND X1 LEFT;  Surgeon: Jovita Kussmaul, MD;  Location: [facility];  Service: General;  Laterality: Bilateral;  ? OTHER SURGICAL HISTORY    ? IVF  ? WISDOM TOOTH EXTRACTION    ?  ?Family History  ?Problem Relation Age of Onset  ? Hypertension Mother    ? Hypertension Father   ? Breast cancer Paternal Aunt 67  ? Prostate cancer Paternal Uncle 36  ?     metastatic  ? Breast cancer Maternal Grandmother 10  ? Heart attack Maternal Grandfather   ? Heart attack Paternal Grandmother   ? Cancer Paternal Grandmother 36  ?     unknown type; metastatic; ?colon  ? Heart attack Paternal Grandfather   ? Ovarian cancer Cousin 39  ?     maternal cousin  ?  ?Social History  ? ?Tobacco Use  ? Smoking status: Never  ? Smokeless tobacco: Never  ?Substance Use Topics  ? Alcohol use: Yes  ?  Comment: Socially   ?  ?Subjective:  ?4 month follow up on pre-diabetes/ hypertension- treatment with [facility]; has lost almost 40 pounds since starting this medication; very excited about her response; Denies any chest pain, shortness of breath, blurred vision or headache. ? ? ?Objective:  ?Vitals:  ? 07/23/21 1522  ?BP: 110/80  ?Pulse: 86  ?Temp: 98.8 ?F (37.1 ?C)  ?TempSrc: Oral  ?SpO2: 98%  ?Weight: 216 lb 12.8 oz (98.3 kg)  ?Height: _0  (1.676 m)  ?  ?General: Well developed, well nourished, in no acute distress  ?Skin : Warm and dry.  ?Head: Normocephalic and atraumatic  ?Lungs: Respirations unlabored; clear to auscultation bilaterally without wheeze, rales, rhonchi  ?CVS exam: normal  rate and regular rhythm.  ?Neurologic: Alert and oriented; speech intact; face symmetrical; moves all extremities well; CNII-XII intact without focal deficit  ? ?Assessment:  ?1. Primary hypertension   ?2. Elevated glucose   ?3. Anxiety and depression   ?  ?Plan:  ?Stable; refill updated; will consider lowering dosage as weight loss continues;  ?Check Hgba1c today;  ?Stable; refill updated; ? ?Follow up in 6 months, sooner prn.  ? ?This visit occurred during the SARS-CoV-2 public health emergency.  Safety protocols were in place, including screening questions prior to the visit, additional usage of staff PPE, and extensive cleaning of exam room while observing appropriate contact time as indicated for  disinfecting solutions.  ? ? ?No follow-ups on file.  ?Orders Placed This Encounter  ?Procedures  ? CBC with Differential/Platelet  ? Comp Met (CMET)  ? Hemoglobin A1c  ?  ?Requested Prescriptions  ? ?Signed Prescriptions Disp Refills  ? Semaglutide-Weight Management (WEGOVY) 2.4 MG/0.75ML SOAJ 3 mL 2  ?  Sig: Inject 2.4 mg into the skin once a week.  ? escitalopram (LEXAPRO) 10 MG tablet 90 tablet 3  ?  Sig: Take 1 tablet (10 mg total) by mouth daily.  ? losartan-hydrochlorothiazide (HYZAAR) 100-25 MG tablet 90 tablet 1  ?  Sig: Take 1 tablet by mouth daily.  ?  ? ?

## 2021-07-24 LAB — COMPREHENSIVE METABOLIC PANEL
AG Ratio: 1.5 (calc) (ref 1.0–2.5)
ALT: 22 U/L (ref 6–29)
AST: 18 U/L (ref 10–30)
Albumin: 4.2 g/dL (ref 3.6–5.1)
Alkaline phosphatase (APISO): 72 U/L (ref 31–125)
BUN: 16 mg/dL (ref 7–25)
CO2: 30 mmol/L (ref 20–32)
Calcium: 8.9 mg/dL (ref 8.6–10.2)
Chloride: 102 mmol/L (ref 98–110)
Creat: 0.9 mg/dL (ref 0.50–0.99)
Globulin: 2.8 g/dL (calc) (ref 1.9–3.7)
Glucose, Bld: 96 mg/dL (ref 65–99)
Potassium: 3.3 mmol/L — ABNORMAL LOW (ref 3.5–5.3)
Sodium: 138 mmol/L (ref 135–146)
Total Bilirubin: 0.4 mg/dL (ref 0.2–1.2)
Total Protein: 7 g/dL (ref 6.1–8.1)

## 2021-07-24 LAB — CBC WITH DIFFERENTIAL/PLATELET
Absolute Monocytes: 549 cells/uL (ref 200–950)
Basophils Absolute: 20 cells/uL (ref 0–200)
Basophils Relative: 0.3 %
Eosinophils Absolute: 302 cells/uL (ref 15–500)
Eosinophils Relative: 4.5 %
HCT: 38.6 % (ref 35.0–45.0)
Hemoglobin: 13.1 g/dL (ref 11.7–15.5)
Lymphs Abs: 1903 cells/uL (ref 850–3900)
MCH: 31.7 pg (ref 27.0–33.0)
MCHC: 33.9 g/dL (ref 32.0–36.0)
MCV: 93.5 fL (ref 80.0–100.0)
MPV: 9.9 fL (ref 7.5–12.5)
Monocytes Relative: 8.2 %
Neutro Abs: 3926 cells/uL (ref 1500–7800)
Neutrophils Relative %: 58.6 %
Platelets: 277 10*3/uL (ref 140–400)
RBC: 4.13 10*6/uL (ref 3.80–5.10)
RDW: 12.2 % (ref 11.0–15.0)
Total Lymphocyte: 28.4 %
WBC: 6.7 10*3/uL (ref 3.8–10.8)

## 2021-07-24 LAB — HEMOGLOBIN A1C
Hgb A1c MFr Bld: 5.4 % of total Hgb (ref <5.7)
Mean Plasma Glucose: 108 mg/dL
eAG (mmol/L): 6 mmol/L

## 2021-07-26 ENCOUNTER — Other Ambulatory Visit: Payer: Self-pay | Admitting: Family

## 2021-07-26 MED ORDER — POTASSIUM CHLORIDE CRYS ER 10 MEQ PO TBCR: 10.0000 meq | EXTENDED_RELEASE_TABLET | Freq: Every day | ORAL | 0 refills | Status: DC

## 2021-07-29 NOTE — Progress Notes (Deleted)
46 y.o. G43P1001 Married White or Caucasian Not Hispanic or Latino female here for annual exam.      No LMP recorded. (Menstrual status: IUD).          Sexually active: {yes no:314532}  The current method of family planning is {contraception:315051}.    Exercising: {yes no:314532}  {types:19826} Smoker:  {YES [id number]  Health Maintenance: Pap:  06/29/20 WNL HR HPV Neg  12/29/17 WNL, no hpv testing History of abnormal Pap:  no MMG:  see Epic  BMD:   none  Colonoscopy: none TDaP:  2021 Gardasil: x1   reports that she has never smoked. She has never used smokeless tobacco. She reports current alcohol use. She reports that she does not currently use drugs.  Past Medical History:  Diagnosis Date   Anxiety    Family history of breast cancer 09/10/2020   Family history of colon cancer 09/10/2020   Family history of prostate cancer 09/10/2020   FH: ovarian cancer 09/10/2020   Hypertension     Past Surgical History:  Procedure Laterality Date   BREAST BIOPSY Left 03/19/2019    FIBROCYSTIC CHANGES WITH CALCIFICATIONS    BREAST LUMPECTOMY WITH RADIOACTIVE SEED LOCALIZATION Bilateral 10/28/2020   Procedure: BILATERAL BREAST LUMPECTOMY WITH RADIOACTIVE SEED LOCALIZATION x2 RIGHT AND X1 LEFT;  Surgeon: Griselda Miner, MD;  Location: [facility];  Service: General;  Laterality: Bilateral;   OTHER SURGICAL HISTORY     IVF   WISDOM TOOTH EXTRACTION      Current Outpatient Medications  Medication Sig Dispense Refill   Biotin 81856 MCG TABS Take by mouth.     escitalopram (LEXAPRO) 10 MG tablet Take 1 tablet (10 mg total) by mouth daily. 90 tablet 3   levonorgestrel (MIRENA) 20 MCG/24HR IUD 1 each by Intrauterine route once.     loratadine (CLARITIN) 10 MG tablet Take 10 mg by mouth daily.     losartan-hydrochlorothiazide (HYZAAR) 100-25 MG tablet Take 1 tablet by mouth daily. 90 tablet 1   MAGNESIUM PO Take by mouth.     Multiple Vitamin (MULTIVITAMIN PO) Take by mouth.      potassium chloride (KLOR-CON M) 10 MEQ tablet Take 1 tablet (10 mEq total) by mouth daily for 5 days. 5 tablet 0   Semaglutide-Weight Management (WEGOVY) 2.4 MG/0.75ML SOAJ Inject 2.4 mg into the skin once a week. 3 mL 2   VITAMIN D PO Take by mouth.     No current facility-administered medications for this visit.    Family History  Problem Relation Age of Onset   Hypertension Mother    Hypertension Father    Breast cancer Paternal Aunt 69   Prostate cancer Paternal Uncle 45       metastatic   Breast cancer Maternal Grandmother 22   Heart attack Maternal Grandfather    Heart attack Paternal Grandmother    Cancer Paternal Grandmother 100       unknown type; metastatic; ?colon   Heart attack Paternal Grandfather    Ovarian cancer Cousin 74       maternal cousin    Review of Systems  Exam:   There were no vitals taken for this visit.  Weight change: @WEIGHTCHANGE @ Height:      Ht Readings from Last 3 Encounters:  07/23/21 5\' 6"  (1.676 m)  07/15/21 5\' 6"  (1.676 m)  03/05/21 5\' 6"  (1.676 m)    General appearance: alert, cooperative and appears stated age Head: Normocephalic, without obvious abnormality, atraumatic Neck: no adenopathy,  supple, symmetrical, trachea midline and thyroid {CHL AMB PHY EX THYROID NORM DEFAULT:[phone]::"normal to inspection and palpation"} Lungs: clear to auscultation bilaterally Cardiovascular: regular rate and rhythm Breasts: {Exam; breast:13139::"normal appearance, no masses or tenderness"} Abdomen: soft, non-tender; non distended,  no masses,  no organomegaly Extremities: extremities normal, atraumatic, no cyanosis or edema Skin: Skin color, texture, turgor normal. No rashes or lesions Lymph nodes: Cervical, supraclavicular, and axillary nodes normal. No abnormal inguinal nodes palpated Neurologic: Grossly normal   Pelvic: External genitalia:  no lesions              Urethra:  normal appearing urethra with no masses, tenderness or lesions               Bartholins and Skenes: normal                 Vagina: normal appearing vagina with normal color and discharge, no lesions              Cervix: {CHL AMB PHY EX CERVIX NORM [phone]::"no lesions"}               Bimanual Exam:  Uterus:  {CHL AMB PHY EX UTERUS NORM DEFAULT:[phone]::"normal size, contour, position, consistency, mobility, non-tender"}              Adnexa: {CHL AMB PHY EX ADNEXA NO MASS [phone]::"no mass, fullness, tenderness"}               Rectovaginal: Confirms               Anus:  normal sphincter tone, no lesions  *** chaperoned for the exam.  A:  Well Woman with normal exam  P:

## 2021-08-04 ENCOUNTER — Ambulatory Visit: Payer: BC Managed Care – PPO | Admitting: Obstetrics and Gynecology

## 2021-08-09 NOTE — Progress Notes (Signed)
46 y.o. G62P1001 Married White or Caucasian Not Hispanic or Latino female here for annual exam.  She has a mirena IUD, placed in 10/20, just occasional spotting.  ?  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  ?Lifetime risk of breast cancer is >30%, she is considering mastectomies  ? ?She has lost 40 lbs in the last 4-5 months.  ? ?No LMP recorded. (Menstrual status: IUD).          ?Sexually active: Yes.    ?The current method of family planning is IUD.   Mirena IUD inserted 01/2019  ?Exercising: Yes.     Walking daily  ?Smoker:  no ? ?Health Maintenance: ?Pap:  06/29/20 WNL HR HPV Neg, 12/29/17 WNL, no hpv testing ?History of abnormal Pap:  no ?MMG:  see epic, recent breast biopsy ?BMD:   n/a ?Colonoscopy: none  ?TDaP:  01/31/20  ?Gardasil: x1  ? ? reports that she has never smoked. She has never used smokeless tobacco. She reports current alcohol use. She reports that she does not currently use drugs. She works in Educational psychologist, Hydrologist. Daughter is 87. Husband is a Professor ? ?Past Medical History:  ?Diagnosis Date  ? Anxiety   ? Family history of breast cancer 09/10/2020  ? Family history of colon cancer 09/10/2020  ? Family history of prostate cancer 09/10/2020  ? FH: ovarian cancer 09/10/2020  ? Hypertension   ? ? ?Past Surgical History:  ?Procedure Laterality Date  ? BREAST BIOPSY Left 03/19/2019  ?  FIBROCYSTIC CHANGES WITH CALCIFICATIONS   ? BREAST LUMPECTOMY WITH RADIOACTIVE SEED LOCALIZATION Bilateral 10/28/2020  ? Procedure: BILATERAL BREAST LUMPECTOMY WITH RADIOACTIVE SEED LOCALIZATION x2 RIGHT AND X1 LEFT;  Surgeon: Jovita Kussmaul, MD;  Location: [place];  Service: General;  Laterality: Bilateral;  ? OTHER SURGICAL HISTORY    ? IVF  ? WISDOM TOOTH EXTRACTION    ? ? ?Current Outpatient Medications  ?Medication Sig Dispense Refill  ? Biotin 10000 MCG TABS Take by mouth.    ? escitalopram (LEXAPRO) 10 MG tablet Take 1 tablet (10 mg total) by mouth daily. 90  tablet 3  ? levonorgestrel (MIRENA) 20 MCG/24HR IUD 1 each by Intrauterine route once.    ? loratadine (CLARITIN) 10 MG tablet Take 10 mg by mouth daily.    ? losartan-hydrochlorothiazide (HYZAAR) 100-25 MG tablet Take 1 tablet by mouth daily. 90 tablet 1  ? MAGNESIUM PO Take by mouth.    ? Multiple Vitamin (MULTIVITAMIN PO) Take by mouth.    ? Semaglutide-Weight Management (WEGOVY) 2.4 MG/0.75ML SOAJ Inject 2.4 mg into the skin once a week. 3 mL 2  ? VITAMIN D PO Take by mouth.    ? potassium chloride (KLOR-CON M) 10 MEQ tablet Take 1 tablet (10 mEq total) by mouth daily for 5 days. 5 tablet 0  ? ?No current facility-administered medications for this visit.  ? ? ?Family History  ?Problem Relation Age of Onset  ? Hypertension Mother   ? Hypertension Father   ? Breast cancer Paternal Aunt 52  ? Prostate cancer Paternal Uncle 60  ?     metastatic  ? Breast cancer Maternal Grandmother 53  ? Heart attack Maternal Grandfather   ? Heart attack Paternal Grandmother   ? Cancer Paternal Grandmother 69  ?     unknown type; metastatic; ?colon  ? Heart attack Paternal Grandfather   ? Ovarian cancer Cousin 47  ?     maternal cousin  ? ? ?  Review of Systems  ?All other systems reviewed and are negative. ? ?Exam:   ?BP 110/84   Pulse 73   Ht 5\' 6"  (1.676 m)   Wt 212 lb (96.2 kg)   SpO2 100%   BMI 34.22 kg/m?   Weight change: @WEIGHTCHANGE @ Height:   Height: 5\' 6"  (167.6 cm)  ?Ht Readings from Last 3 Encounters:  ?08/16/21 5\' 6"  (1.676 m)  ?07/23/21 5\' 6"  (1.676 m)  ?07/15/21 5\' 6"  (1.676 m)  ? ? ?General appearance: alert, cooperative and appears stated age ?Head: Normocephalic, without obvious abnormality, atraumatic ?Neck: no adenopathy, supple, symmetrical, trachea midline and thyroid normal to inspection and palpation ?Lungs: clear to auscultation bilaterally ?Cardiovascular: regular rate and rhythm ?Breasts: normal appearance, no masses or tenderness ?Abdomen: soft, non-tender; non distended,  no masses,  no  organomegaly ?Extremities: extremities normal, atraumatic, no cyanosis or edema ?Skin: Skin color, texture, turgor normal. No rashes or lesions ?Lymph nodes: Cervical, supraclavicular, and axillary nodes normal. ?No abnormal inguinal nodes palpated ?Neurologic: Grossly normal ? ? ?Pelvic: External genitalia:  no lesions ?             Urethra:  normal appearing urethra with no masses, tenderness or lesions ?             Bartholins and Skenes: normal    ?             Vagina: normal appearing vagina with normal color and discharge, no lesions ?             Cervix: no lesions and IUD string 3 cm ?              ?Bimanual Exam:  Uterus:  normal size, contour, position, consistency, mobility, non-tender ?             Adnexa: no mass, fullness, tenderness ?              Rectovaginal: Confirms ?              Anus:  normal sphincter tone, no lesions ? ?Santiago Glad, CMA chaperoned for the exam. ? ?1. Well woman exam ?Discussed breast self exam ?Discussed calcium and vit D intake ?Mammogram UTD ?Labs with primary ?No pap this year ? ?2. Immunization due ?- HPV 9-valent vaccine,Recombinat ? ?3. IUD check up ?Doing well ? ? ?

## 2021-08-16 ENCOUNTER — Ambulatory Visit (INDEPENDENT_AMBULATORY_CARE_PROVIDER_SITE_OTHER): Payer: BC Managed Care – PPO | Admitting: Obstetrics and Gynecology

## 2021-08-16 ENCOUNTER — Encounter: Payer: Self-pay | Admitting: Obstetrics and Gynecology

## 2021-08-16 VITALS — BP 110/84 | HR 73 | Ht 66.0 in | Wt 212.0 lb

## 2021-08-16 DIAGNOSIS — Z30431 Encounter for routine checking of intrauterine contraceptive device: Secondary | ICD-10-CM

## 2021-08-16 DIAGNOSIS — Z23 Encounter for immunization: Secondary | ICD-10-CM

## 2021-08-16 DIAGNOSIS — Z01419 Encounter for gynecological examination (general) (routine) without abnormal findings: Secondary | ICD-10-CM

## 2021-08-16 NOTE — Patient Instructions (Signed)

## 2021-10-11 ENCOUNTER — Encounter: Payer: Self-pay | Admitting: Hematology and Oncology

## 2021-10-11 ENCOUNTER — Inpatient Hospital Stay: Payer: BC Managed Care – PPO | Attending: Hematology and Oncology | Admitting: Hematology and Oncology

## 2021-10-11 ENCOUNTER — Other Ambulatory Visit: Payer: Self-pay

## 2021-10-11 VITALS — BP 134/91 | HR 69 | Temp 97.0°F | Resp 17 | Wt 217.6 lb

## 2021-10-11 DIAGNOSIS — Z9189 Other specified personal risk factors, not elsewhere classified: Secondary | ICD-10-CM | POA: Diagnosis not present

## 2021-10-11 DIAGNOSIS — Z79899 Other long term (current) drug therapy: Secondary | ICD-10-CM | POA: Diagnosis not present

## 2021-10-11 DIAGNOSIS — N6091 Unspecified benign mammary dysplasia of right breast: Secondary | ICD-10-CM | POA: Insufficient documentation

## 2021-10-11 NOTE — Progress Notes (Signed)
[facility] CONSULT NOTE  Patient Care Team: Olive Bass, FNP as PCP - General (Internal Medicine)  CHIEF COMPLAINTS/PURPOSE OF CONSULTATION:  Atypical ductal hyperplasia.  ASSESSMENT & PLAN:   This is a very pleasant 46 year old female patient who was initially seen by atypical ductal hyperplasia back in June and high risk breast clinic who is here for follow-up after bilateral lumpectomy.   Since last visit, she had left breast mass noted on the mammogram and underwent another procedure. 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 No malignancy. She says she is tired of these findings, every time she does a mammogram, she has abnormal findings which lead to biopsy and surgery.  During her last visit, she did not want to proceed with MRI this year since she is tired of all these procedures.  She would rather do a mammogram in March 2024.  She recently had breast exam by gynecology in March and deferred breast exam today.  She otherwise denies any new complaints. Rest of the physical examination today without breast exam normal. We once again discussed about age-appropriate cancer screening, bilateral screening mammogram ordered for March 2024, she has to start screening colonoscopies next year when she turns 47 and Pap every 3 to 5 years based on the technique.  We will try to alternate appointments between eyes and gynecology for breast exam, I will see her next in January 2024.  She will continue self breast exams monthly and report any changes to [name].  Thank you for consulting [name] in the care of this patient.  Please do not hesitate to contact us with any additional questions or concerns.  HISTORY OF PRESENTING ILLNESS:  Tanya Raymond 46 y.o. female is here because of ADH   Ms Ertel is a very pleasant 46 yr old female patient with PMH significant for HTN, depression referred to hematology for abnormal mammogram.  Screening  mammogram 06/09/2020 IMPRESSION: Further evaluation is suggested for possible mass with calcifications in the right breast.   Further evaluation is suggested for possible mass with calcifications in the left breast.  07/01/20  Diagnostic mammogram : Indeterminate right breast calcifications forming 2-3 distinct groups in the upper outer quadrant. Recommendation is for stereotactic biopsy of the largest group of calcifications. If these demonstrate benign pathology, additional six-month follow-up is recommended for the additional calcifications. 2. Probably benign left breast calcifications. If above pathology demonstrates benign findings, additional six-month follow-up is recommended.  07/03/2020  Right breast needle core biopsy showed ADH  07/15/2020  Other biopsies showed atypical apocrine hyperplasia and fibrocystic changes.  She had right and left breast lumpectomy on 10/28/2020 by Dr Carolynne Edouard. Pathology showed fibrocystic change, extensive biopsy site changes, no atypia or malignancy in left breast. Right breast showed fibrocystic change with apocrine metaplasia, complex sclerosing lesion, pseudoangiomatous stromal hyperplasia, extensive biopsy site change. No atypia or malignancy.  She had another abnormal mammogram on 06/10/2021 which showed left breast mass. Pathology showed fibrocystic changes, cystic apocrine metaplasia.  Interval history  She is here for follow-up.  Since last visit she denies any new breast changes.  She does not want to do the MRI this year.  If she ends up having another lumpectomy, she wants to consider bilateral mastectomy.  She denies any other new health issues.  She is now on [facility] for weight loss and has lost about 40 pounds, she is very thrilled by it. Rest of the pertinent 10 point ROS reviewed and negative  MEDICAL HISTORY:  Past  Medical History:  Diagnosis Date   Anxiety    Family history of breast cancer 09/10/2020   Family history of colon  cancer 09/10/2020   Family history of prostate cancer 09/10/2020   FH: ovarian cancer 09/10/2020   Hypertension     SURGICAL HISTORY: Past Surgical History:  Procedure Laterality Date   BREAST BIOPSY Left 03/19/2019    FIBROCYSTIC CHANGES WITH CALCIFICATIONS    BREAST LUMPECTOMY WITH RADIOACTIVE SEED LOCALIZATION Bilateral 10/28/2020   Procedure: BILATERAL BREAST LUMPECTOMY WITH RADIOACTIVE SEED LOCALIZATION x2 RIGHT AND X1 LEFT;  Surgeon: Griselda Miner, MD;  Location: [facility];  Service: General;  Laterality: Bilateral;   OTHER SURGICAL HISTORY     IVF   WISDOM TOOTH EXTRACTION      SOCIAL HISTORY: Social History   Socioeconomic History   Marital status: Married    Spouse name: Not on file   Number of children: Not on file   Years of education: Not on file   Highest education level: Not on file  Occupational History   Not on file  Tobacco Use   Smoking status: Never   Smokeless tobacco: Never  Vaping Use   Vaping Use: Never used  Substance and Sexual Activity   Alcohol use: Yes    Comment: Socially    Drug use: Not Currently   Sexual activity: Yes    Birth control/protection: I.U.D.  Other Topics Concern   Not on file  Social History Narrative   Not on file   Social Determinants of Health   Financial Resource Strain: Not on file  Food Insecurity: Not on file  Transportation Needs: Not on file  Physical Activity: Not on file  Stress: Not on file  Social Connections: Not on file  Intimate Partner Violence: Not on file    FAMILY HISTORY: Family History  Problem Relation Age of Onset   Hypertension Mother    Hypertension Father    Breast cancer Paternal Aunt 88   Prostate cancer Paternal Uncle 29       metastatic   Breast cancer Maternal Grandmother 72   Heart attack Maternal Grandfather    Heart attack Paternal Grandmother    Cancer Paternal Grandmother 73       unknown type; metastatic; ?colon   Heart attack Paternal Grandfather     Ovarian cancer Cousin 65       maternal cousin    ALLERGIES:  has No Known Allergies.  MEDICATIONS:  Current Outpatient Medications  Medication Sig Dispense Refill   Biotin 25053 MCG TABS Take by mouth.     escitalopram (LEXAPRO) 10 MG tablet Take 1 tablet (10 mg total) by mouth daily. 90 tablet 3   levonorgestrel (MIRENA) 20 MCG/24HR IUD 1 each by Intrauterine route once.     loratadine (CLARITIN) 10 MG tablet Take 10 mg by mouth daily.     losartan-hydrochlorothiazide (HYZAAR) 100-25 MG tablet Take 1 tablet by mouth daily. 90 tablet 1   MAGNESIUM PO Take by mouth.     Multiple Vitamin (MULTIVITAMIN PO) Take by mouth.     potassium chloride (KLOR-CON M) 10 MEQ tablet Take 1 tablet (10 mEq total) by mouth daily for 5 days. 5 tablet 0   Semaglutide-Weight Management (WEGOVY) 2.4 MG/0.75ML SOAJ Inject 2.4 mg into the skin once a week. 3 mL 2   VITAMIN D PO Take by mouth.     No current facility-administered medications for this visit.     PHYSICAL EXAMINATION:  ECOG PERFORMANCE  STATUS: 0 - Asymptomatic  Vitals:   10/11/21 0925  BP: (!) 134/91  Pulse: 69  Resp: 17  Temp: (!) 97 F (36.1 C)  SpO2: 100%    Filed Weights   10/11/21 0925  Weight: 217 lb 9 oz (98.7 kg)    Physical Exam Constitutional:      Appearance: Normal appearance.  HENT:     Head: Normocephalic and atraumatic.  Cardiovascular:     Rate and Rhythm: Regular rhythm.     Pulses: Normal pulses.     Heart sounds: Normal heart sounds.  Pulmonary:     Effort: Pulmonary effort is normal.     Breath sounds: Normal breath sounds.  Chest:     Comments: Declined breast exam, she said she recently had a breast exam with her gynecologist Abdominal:     General: Abdomen is flat. Bowel sounds are normal.     Palpations: Abdomen is soft.  Musculoskeletal:     Cervical back: Normal range of motion and neck supple. No rigidity.  Lymphadenopathy:     Cervical: No cervical adenopathy.  Skin:    General: Skin  is warm and dry.  Neurological:     General: No focal deficit present.     Mental Status: She is alert and oriented to person, place, and time.     LABORATORY DATA:  I have reviewed the data as listed Lab Results  Component Value Date   WBC 6.7 07/23/2021   HGB 13.1 07/23/2021   HCT 38.6 07/23/2021   MCV 93.5 07/23/2021   PLT 277 07/23/2021     Chemistry      Component Value Date/Time   NA 138 07/23/2021 1554   K 3.3 (L) 07/23/2021 1554   CL 102 07/23/2021 1554   CO2 30 07/23/2021 1554   BUN 16 07/23/2021 1554   CREATININE 0.90 07/23/2021 1554      Component Value Date/Time   CALCIUM 8.9 07/23/2021 1554   ALKPHOS 79 03/05/2021 0859   AST 18 07/23/2021 1554   ALT 22 07/23/2021 1554   BILITOT 0.4 07/23/2021 1554      MRI without any evidence of malignancy.  Routine annual screening with mammography and breast MRI recommended.  Patient is due for her next screening mammogram in March 2023.  RADIOGRAPHIC STUDIES:  I have personally reviewed the radiological images as listed and agreed with the findings in the report. No results found.  All questions were answered. The patient knows to call the clinic with any problems, questions or concerns.  I have spent 20 minutes in the care of the patient, discussed pathology results,  role of annual MRI screening, lifetime risk of breast cancer and other measures for risk reduction   Rachel Moulds, MD 10/11/2021 9:26 AM

## 2021-10-13 ENCOUNTER — Telehealth: Payer: Self-pay | Admitting: *Deleted

## 2021-10-13 NOTE — Telephone Encounter (Signed)
Prior auth started via cover my meds.  Awaiting determination.  Key: [id number]

## 2021-10-13 NOTE — Telephone Encounter (Signed)
As long as you remain covered by the [facility] and there are no changes to your plan benefits, this request is approved for the following time period: 10/13/2021 - 10/14/2022

## 2021-11-08 ENCOUNTER — Other Ambulatory Visit: Payer: Self-pay | Admitting: Family

## 2022-01-13 ENCOUNTER — Ambulatory Visit: Payer: BC Managed Care – PPO | Admitting: Hematology and Oncology

## 2022-01-31 ENCOUNTER — Other Ambulatory Visit: Payer: Self-pay | Admitting: Family

## 2022-03-13 ENCOUNTER — Other Ambulatory Visit: Payer: Self-pay | Admitting: Family

## 2022-03-14 ENCOUNTER — Telehealth: Payer: Self-pay | Admitting: Family

## 2022-03-14 NOTE — Telephone Encounter (Signed)
Last refill on Wegovy without OV; last appointment 07/2021;

## 2022-03-16 NOTE — Telephone Encounter (Signed)
Appointment pending in January.

## 2022-03-16 NOTE — Telephone Encounter (Signed)
Letter sent to my chart

## 2022-04-14 ENCOUNTER — Other Ambulatory Visit: Payer: Self-pay | Admitting: Family

## 2022-04-15 ENCOUNTER — Encounter: Payer: Self-pay | Admitting: Family

## 2022-04-15 ENCOUNTER — Ambulatory Visit: Payer: BC Managed Care – PPO | Admitting: Family

## 2022-04-15 VITALS — BP 122/72 | HR 88 | Temp 98.6°F | Ht 66.0 in | Wt 223.4 lb

## 2022-04-15 DIAGNOSIS — F32A Depression, unspecified: Secondary | ICD-10-CM | POA: Diagnosis not present

## 2022-04-15 DIAGNOSIS — Z6836 Body mass index (BMI) 36.0-36.9, adult: Secondary | ICD-10-CM

## 2022-04-15 DIAGNOSIS — I10 Essential (primary) hypertension: Secondary | ICD-10-CM | POA: Diagnosis not present

## 2022-04-15 DIAGNOSIS — E66812 Obesity, class 2: Secondary | ICD-10-CM

## 2022-04-15 DIAGNOSIS — F419 Anxiety disorder, unspecified: Secondary | ICD-10-CM | POA: Diagnosis not present

## 2022-04-15 MED ORDER — LOSARTAN POTASSIUM-HCTZ 100-25 MG PO TABS
1.0000 | ORAL_TABLET | Freq: Every day | ORAL | 3 refills | Status: DC
Start: 2022-04-15 — End: 2023-04-17

## 2022-04-15 NOTE — Progress Notes (Signed)
Tanya Raymond is a 47 y.o. female with the following history as recorded in EpicCare:  Patient Active Problem List   Diagnosis Date Noted   Genetic testing 09/18/2020   Family history of breast cancer 09/10/2020   FH: ovarian cancer 09/10/2020   Family history of prostate cancer 09/10/2020   Family history of colon cancer 09/10/2020    Current Outpatient Medications  Medication Sig Dispense Refill   Biotin 10000 MCG TABS Take by mouth.     escitalopram (LEXAPRO) 10 MG tablet Take 1 tablet (10 mg total) by mouth daily. 90 tablet 3   levonorgestrel (MIRENA) 20 MCG/24HR IUD 1 each by Intrauterine route once.     loratadine (CLARITIN) 10 MG tablet Take 10 mg by mouth daily.     MAGNESIUM PO Take by mouth.     Multiple Vitamin (MULTIVITAMIN PO) Take by mouth.     VITAMIN D PO Take by mouth.     losartan-hydrochlorothiazide (HYZAAR) 100-25 MG tablet Take 1 tablet by mouth daily. 90 tablet 3   potassium chloride (KLOR-CON M) 10 MEQ tablet Take 1 tablet (10 mEq total) by mouth daily for 5 days. 5 tablet 0   Semaglutide-Weight Management (WEGOVY) 2.4 MG/0.75ML SOAJ INJECT 2.4 MG INTO THE SKIN ONCE A WEEK. 3 mL 1   No current facility-administered medications for this visit.    Allergies: Patient has no known allergies.  Past Medical History:  Diagnosis Date   Anxiety    Family history of breast cancer 09/10/2020   Family history of colon cancer 09/10/2020   Family history of prostate cancer 09/10/2020   FH: ovarian cancer 09/10/2020   Hypertension     Past Surgical History:  Procedure Laterality Date   BREAST BIOPSY Left 03/19/2019    FIBROCYSTIC CHANGES WITH CALCIFICATIONS    BREAST LUMPECTOMY WITH RADIOACTIVE SEED LOCALIZATION Bilateral 10/28/2020   Procedure: BILATERAL BREAST LUMPECTOMY WITH RADIOACTIVE SEED LOCALIZATION x2 RIGHT AND X1 LEFT;  Surgeon: Jovita Kussmaul, MD;  Location: [place];  Service: General;  Laterality: Bilateral;   OTHER SURGICAL  HISTORY     IVF   WISDOM TOOTH EXTRACTION      Family History  Problem Relation Age of Onset   Hypertension Mother    Hypertension Father    Breast cancer Paternal Aunt 20   Prostate cancer Paternal Uncle 54       metastatic   Breast cancer Maternal Grandmother 70   Heart attack Maternal Grandfather    Heart attack Paternal Grandmother    Cancer Paternal Grandmother 70       unknown type; metastatic; ?colon   Heart attack Paternal Grandfather    Ovarian cancer Cousin 64       maternal cousin    Social History   Tobacco Use   Smoking status: Never   Smokeless tobacco: Never  Substance Use Topics   Alcohol use: Yes    Comment: Socially     Subjective:  6 month follow up on chronic care needs; has been doing well on Wegovy 2.4 mg- will soon have been on this dose for almost 1 year; Denies any chest pain, shortness of breath, blurred vision or headache Continuing to work with her oncologist for management of increased risk of breast cancer- mammogram is scheduled;    Objective:  Vitals:   04/15/22 1502  BP: 122/72  Pulse: 88  Temp: 98.6 F (37 C)  TempSrc: Oral  SpO2: 98%  Weight: 223 lb 6.4 oz (101.3 kg)  Height: 5\' 6"  (1.676 m)    General: Well developed, well nourished, in no acute distress  Skin : Warm and dry.  Head: Normocephalic and atraumatic  Lungs: Respirations unlabored; clear to auscultation bilaterally without wheeze, rales, rhonchi  CVS exam: normal rate and regular rhythm.  Neurologic: Alert and oriented; speech intact; face symmetrical; moves all extremities well; CNII-XII intact without focal deficit   Assessment:  1. Primary hypertension   2. Anxiety and depression   3. Class 2 severe obesity with serious comorbidity and body mass index (BMI) of 36.0 to 36.9 in adult, unspecified obesity type ([place])     Plan:  Stable; refill updated; Stable on current dosage of Lexapro; Refill updated for Wegovy 2.4; will plan to continue this dosage x 4-6  more months and then begin to taper patient off medication; patient is in agreement.   No follow-ups on file.  No orders of the defined types were placed in this encounter.   Requested Prescriptions   Signed Prescriptions Disp Refills   losartan-hydrochlorothiazide (HYZAAR) 100-25 MG tablet 90 tablet 3    Sig: Take 1 tablet by mouth daily.

## 2022-04-18 ENCOUNTER — Encounter: Payer: Self-pay | Admitting: Hematology and Oncology

## 2022-04-18 ENCOUNTER — Inpatient Hospital Stay: Payer: BC Managed Care – PPO | Attending: Hematology and Oncology | Admitting: Hematology and Oncology

## 2022-04-18 VITALS — BP 135/92 | HR 61 | Temp 98.0°F | Resp 18 | Ht 66.0 in | Wt 224.9 lb

## 2022-04-18 DIAGNOSIS — I10 Essential (primary) hypertension: Secondary | ICD-10-CM | POA: Diagnosis not present

## 2022-04-18 DIAGNOSIS — Z803 Family history of malignant neoplasm of breast: Secondary | ICD-10-CM | POA: Diagnosis not present

## 2022-04-18 DIAGNOSIS — N6099 Unspecified benign mammary dysplasia of unspecified breast: Secondary | ICD-10-CM | POA: Insufficient documentation

## 2022-04-18 DIAGNOSIS — Z9189 Other specified personal risk factors, not elsewhere classified: Secondary | ICD-10-CM | POA: Diagnosis not present

## 2022-04-18 DIAGNOSIS — Z79899 Other long term (current) drug therapy: Secondary | ICD-10-CM | POA: Diagnosis not present

## 2022-04-18 NOTE — Progress Notes (Signed)
Tanya Raymond CONSULT NOTE  Patient Care Team: Olive Bass, FNP as PCP - General (Internal Medicine)  CHIEF COMPLAINTS/PURPOSE OF CONSULTATION:  Atypical ductal hyperplasia.  ASSESSMENT & PLAN:  This is a very pleasant 47 year old female patient who was initially seen by atypical ductal hyperplasia back in June and high risk breast clinic who is here for follow-up after bilateral lumpectomy.   She did not want to do annual MRIs at this time.  She was really tired of doing additional biopsies.  She wants to continue annual mammograms and twice a year physical exam with OB/GYN as well as as alternating.  Since her last visit here, she has lost 30 pounds with the help of regular exercise and Wegovy.  She is satisfied and is working on losing more.  She denies any breast changes.  No concerns on physical examination.  Bilateral breast examined.  No masses or regional adenopathy.  Her mammogram is scheduled for March 2024.  She was encouraged to continue self breast exams once a month and report any changes to [name].  She will see her OB/GYN during summer and return to clinic with [name] in about a year. Thank you for consulting [name] in the care of this patient.  Please do not hesitate to contact us with any additional questions or concerns.  HISTORY OF PRESENTING ILLNESS:  Tanya Raymond 47 y.o. female is here because of ADH   Ms Tanya Raymond is a very pleasant 47 yr old female patient with PMH significant for HTN, depression referred to hematology for abnormal mammogram.  Screening mammogram 06/09/2020 IMPRESSION: Further evaluation is suggested for possible mass with calcifications in the right breast.   Further evaluation is suggested for possible mass with calcifications in the left breast.  07/01/20  Diagnostic mammogram : Indeterminate right breast calcifications forming 2-3 distinct groups in the upper outer quadrant. Recommendation is for stereotactic biopsy of the  largest group of calcifications. If these demonstrate benign pathology, additional six-month follow-up is recommended for the additional calcifications. 2. Probably benign left breast calcifications. If above pathology demonstrates benign findings, additional six-month follow-up is recommended.  07/03/2020  Right breast needle core biopsy showed ADH  07/15/2020  Other biopsies showed atypical apocrine hyperplasia and fibrocystic changes.  She had right and left breast lumpectomy on 10/28/2020 by Dr Carolynne Edouard. Pathology showed fibrocystic change, extensive biopsy site changes, no atypia or malignancy in left breast. Right breast showed fibrocystic change with apocrine metaplasia, complex sclerosing lesion, pseudoangiomatous stromal hyperplasia, extensive biopsy site change. No atypia or malignancy.  She had another abnormal mammogram on 06/10/2021 which showed left breast mass. Pathology showed fibrocystic changes, cystic apocrine metaplasia.  Interval history  She is here for follow-up.  Since last visit she denies any new breast changes.  She lost about 30 pounds since her last visit here.  She is very satisfied.  She is using [place] and has also been exercising regularly.  She has a mammogram set up for March 2024. Rest of the pertinent 10 point ROS reviewed and negative  MEDICAL HISTORY:  Past Medical History:  Diagnosis Date   Anxiety    Family history of breast cancer 09/10/2020   Family history of colon cancer 09/10/2020   Family history of prostate cancer 09/10/2020   FH: ovarian cancer 09/10/2020   Hypertension     SURGICAL HISTORY: Past Surgical History:  Procedure Laterality Date   BREAST BIOPSY Left 03/19/2019    FIBROCYSTIC CHANGES WITH CALCIFICATIONS    BREAST  LUMPECTOMY WITH RADIOACTIVE SEED LOCALIZATION Bilateral 10/28/2020   Procedure: BILATERAL BREAST LUMPECTOMY WITH RADIOACTIVE SEED LOCALIZATION x2 RIGHT AND X1 LEFT;  Surgeon: Jovita Kussmaul, MD;  Location: [facility];  Service: General;  Laterality: Bilateral;   OTHER SURGICAL HISTORY     IVF   WISDOM TOOTH EXTRACTION      SOCIAL HISTORY: Social History   Socioeconomic History   Marital status: Married    Spouse name: Not on file   Number of children: Not on file   Years of education: Not on file   Highest education level: Not on file  Occupational History   Not on file  Tobacco Use   Smoking status: Never   Smokeless tobacco: Never  Vaping Use   Vaping Use: Never used  Substance and Sexual Activity   Alcohol use: Yes    Comment: Socially    Drug use: Not Currently   Sexual activity: Yes    Birth control/protection: I.U.D.  Other Topics Concern   Not on file  Social History Narrative   Not on file   Social Determinants of Health   Financial Resource Strain: Not on file  Food Insecurity: Not on file  Transportation Needs: Not on file  Physical Activity: Not on file  Stress: Not on file  Social Connections: Not on file  Intimate Partner Violence: Not on file    FAMILY HISTORY: Family History  Problem Relation Age of Onset   Hypertension Mother    Hypertension Father    Breast cancer Paternal Aunt 33   Prostate cancer Paternal Uncle 34       metastatic   Breast cancer Maternal Grandmother 56   Heart attack Maternal Grandfather    Heart attack Paternal Grandmother    Cancer Paternal Grandmother 70       unknown type; metastatic; ?colon   Heart attack Paternal Grandfather    Ovarian cancer Cousin 70       maternal cousin    ALLERGIES:  has No Known Allergies.  MEDICATIONS:  Current Outpatient Medications  Medication Sig Dispense Refill   Biotin 10000 MCG TABS Take by mouth.     escitalopram (LEXAPRO) 10 MG tablet Take 1 tablet (10 mg total) by mouth daily. 90 tablet 3   levonorgestrel (MIRENA) 20 MCG/24HR IUD 1 each by Intrauterine route once.     loratadine (CLARITIN) 10 MG tablet Take 10 mg by mouth daily.     losartan-hydrochlorothiazide  (HYZAAR) 100-25 MG tablet Take 1 tablet by mouth daily. 90 tablet 3   MAGNESIUM PO Take by mouth.     Multiple Vitamin (MULTIVITAMIN PO) Take by mouth.     potassium chloride (KLOR-CON M) 10 MEQ tablet Take 1 tablet (10 mEq total) by mouth daily for 5 days. 5 tablet 0   Semaglutide-Weight Management (WEGOVY) 2.4 MG/0.75ML SOAJ INJECT 2.4 MG INTO THE SKIN ONCE A WEEK. 3 mL 1   VITAMIN D PO Take by mouth.     No current facility-administered medications for this visit.     PHYSICAL EXAMINATION:  ECOG PERFORMANCE STATUS: 0 - Asymptomatic  Vitals:   04/18/22 0857  BP: (!) 135/92  Pulse: 61  Resp: 18  Temp: 98 F (36.7 C)  SpO2: 100%    Filed Weights   04/18/22 0857  Weight: 224 lb 14.4 oz (102 kg)    Physical Exam Constitutional:      Appearance: Normal appearance.  HENT:     Head: Normocephalic and atraumatic.  Chest:  Comments: Bilateral breast examined.  No palpable masses or regional adenopathy Abdominal:     General: Abdomen is flat. Bowel sounds are normal.     Palpations: Abdomen is soft.  Musculoskeletal:     Cervical back: Normal range of motion and neck supple. No rigidity.  Lymphadenopathy:     Cervical: No cervical adenopathy.  Neurological:     General: No focal deficit present.     Mental Status: She is alert.  Psychiatric:        Mood and Affect: Mood normal.     LABORATORY DATA:  I have reviewed the data as listed Lab Results  Component Value Date   WBC 6.7 07/23/2021   HGB 13.1 07/23/2021   HCT 38.6 07/23/2021   MCV 93.5 07/23/2021   PLT 277 07/23/2021     Chemistry      Component Value Date/Time   NA 138 07/23/2021 1554   K 3.3 (L) 07/23/2021 1554   CL 102 07/23/2021 1554   CO2 30 07/23/2021 1554   BUN 16 07/23/2021 1554   CREATININE 0.90 07/23/2021 1554      Component Value Date/Time   CALCIUM 8.9 07/23/2021 1554   ALKPHOS 79 03/05/2021 0859   AST 18 07/23/2021 1554   ALT 22 07/23/2021 1554   BILITOT 0.4 07/23/2021 1554       MRI without any evidence of malignancy.  Routine annual screening with mammography and breast MRI recommended.  Patient is due for her next screening mammogram in March 2023.  RADIOGRAPHIC STUDIES:  I have personally reviewed the radiological images as listed and agreed with the findings in the report. No results found.  All questions were answered. The patient knows to call the clinic with any problems, questions or concerns.  I have spent 20 minutes in the care of the patient, discussed pathology results,  role of annual MRI screening, lifetime risk of breast cancer and other measures for risk reduction   Benay Pike, MD 04/18/2022 9:39 AM

## 2022-05-07 ENCOUNTER — Other Ambulatory Visit: Payer: Self-pay | Admitting: Family

## 2022-06-07 ENCOUNTER — Ambulatory Visit
Admission: RE | Admit: 2022-06-07 | Discharge: 2022-06-07 | Disposition: A | Discharge status: Home / Self Care | Payer: BC Managed Care – PPO | Source: Ambulatory Visit | Attending: Hematology and Oncology | Admitting: Hematology and Oncology

## 2022-06-07 DIAGNOSIS — Z9189 Other specified personal risk factors, not elsewhere classified: Secondary | ICD-10-CM

## 2022-06-10 ENCOUNTER — Other Ambulatory Visit: Payer: Self-pay | Admitting: Hematology and Oncology

## 2022-06-10 DIAGNOSIS — R928 Other abnormal and inconclusive findings on diagnostic imaging of breast: Secondary | ICD-10-CM

## 2022-06-20 ENCOUNTER — Ambulatory Visit
Admission: RE | Admit: 2022-06-20 | Discharge: 2022-06-20 | Disposition: A | Discharge status: Home / Self Care | Payer: BC Managed Care – PPO | Source: Ambulatory Visit | Attending: Hematology and Oncology | Admitting: Hematology and Oncology

## 2022-06-20 DIAGNOSIS — R928 Other abnormal and inconclusive findings on diagnostic imaging of breast: Secondary | ICD-10-CM

## 2022-11-17 ENCOUNTER — Encounter (INDEPENDENT_AMBULATORY_CARE_PROVIDER_SITE_OTHER): Payer: Self-pay

## 2022-12-09 ENCOUNTER — Ambulatory Visit: Payer: BC Managed Care – PPO | Admitting: Family

## 2022-12-09 ENCOUNTER — Encounter: Payer: Self-pay | Admitting: Family

## 2022-12-09 VITALS — BP 138/86 | HR 70 | Resp 18 | Ht 66.0 in | Wt 237.6 lb

## 2022-12-09 DIAGNOSIS — R7309 Other abnormal glucose: Secondary | ICD-10-CM

## 2022-12-09 DIAGNOSIS — I10 Essential (primary) hypertension: Secondary | ICD-10-CM

## 2022-12-09 DIAGNOSIS — Z1211 Encounter for screening for malignant neoplasm of colon: Secondary | ICD-10-CM

## 2022-12-09 DIAGNOSIS — R635 Abnormal weight gain: Secondary | ICD-10-CM | POA: Diagnosis not present

## 2022-12-09 NOTE — Progress Notes (Signed)
Tanya Raymond is a 47 y.o. female with the following history as recorded in EpicCare:  Patient Active Problem List   Diagnosis Date Noted   Genetic testing 09/18/2020   Family history of breast cancer 09/10/2020   FH: ovarian cancer 09/10/2020   Family history of prostate cancer 09/10/2020   Family history of colon cancer 09/10/2020    Current Outpatient Medications  Medication Sig Dispense Refill   Biotin 16109 MCG TABS Take by mouth.     escitalopram (LEXAPRO) 10 MG tablet TAKE 1 TABLET BY MOUTH EVERY DAY 90 tablet 3   levonorgestrel (MIRENA) 20 MCG/24HR IUD 1 each by Intrauterine route once.     loratadine (CLARITIN) 10 MG tablet Take 10 mg by mouth daily.     losartan-hydrochlorothiazide (HYZAAR) 100-25 MG tablet Take 1 tablet by mouth daily. 90 tablet 3   MAGNESIUM PO Take by mouth.     Multiple Vitamin (MULTIVITAMIN PO) Take by mouth.     VITAMIN D PO Take by mouth.     No current facility-administered medications for this visit.    Allergies: Patient has no known allergies.  Past Medical History:  Diagnosis Date   Anxiety    Family history of breast cancer 09/10/2020   Family history of colon cancer 09/10/2020   Family history of prostate cancer 09/10/2020   FH: ovarian cancer 09/10/2020   Hypertension     Past Surgical History:  Procedure Laterality Date   BREAST BIOPSY Left 03/19/2019    FIBROCYSTIC CHANGES WITH CALCIFICATIONS    BREAST LUMPECTOMY WITH RADIOACTIVE SEED LOCALIZATION Bilateral 10/28/2020   Procedure: BILATERAL BREAST LUMPECTOMY WITH RADIOACTIVE SEED LOCALIZATION x2 RIGHT AND X1 LEFT;  Surgeon: Griselda Miner, MD;  Location: [facility];  Service: General;  Laterality: Bilateral;   OTHER SURGICAL HISTORY     IVF   WISDOM TOOTH EXTRACTION      Family History  Problem Relation Age of Onset   Hypertension Mother    Hypertension Father    Breast cancer Paternal Aunt 8   Prostate cancer Paternal Uncle 66       metastatic    Breast cancer Maternal Grandmother 70   Heart attack Maternal Grandfather    Heart attack Paternal Grandmother    Cancer Paternal Grandmother 58       unknown type; metastatic; ?colon   Heart attack Paternal Grandfather    Ovarian cancer Cousin 37       maternal cousin    Social History   Tobacco Use   Smoking status: Never   Smokeless tobacco: Never  Substance Use Topics   Alcohol use: Yes    Comment: Socially     Subjective:   Presents for follow up on chronic care needs- no acute concerns; did have to stop Wegovy as was not covered by insurance; Denies any chest pain, shortness of breath, blurred vision or headache   Objective:  Vitals:   12/09/22 1550  BP: 138/86  Pulse: 70  Resp: 18  SpO2: 99%  Weight: 237 lb 9.6 oz (107.8 kg)  Height: 5\' 6"  (1.676 m)    General: Well developed, well nourished, in no acute distress  Skin : Warm and dry.  Head: Normocephalic and atraumatic  Lungs: Respirations unlabored; clear to auscultation bilaterally without wheeze, rales, rhonchi  CVS exam: normal rate and regular rhythm.  Neurologic: Alert and oriented; speech intact; face symmetrical; moves all extremities well; CNII-XII intact without focal deficit   Assessment:  1. Primary hypertension  2. Encounter for screening colonoscopy   3. Elevated glucose   4. Weight gain     Plan:  Stable; continue same medication; Order updated; Check Hgba1c; Check Tsh;   No follow-ups on file.  Orders Placed This Encounter  Procedures   CBC with Differential/Platelet   Comp Met (CMET)   Hemoglobin A1c   TSH   Ambulatory referral to Gastroenterology    Referral Priority:   Routine    Referral Type:   Consultation    Referral Reason:   Specialty Services Required    Number of Visits Requested:   1    Requested Prescriptions    No prescriptions requested or ordered in this encounter

## 2022-12-10 LAB — CBC WITH DIFFERENTIAL/PLATELET
Absolute Monocytes: 567 {cells}/uL (ref 200–950)
Basophils Absolute: 19 {cells}/uL (ref 0–200)
Basophils Relative: 0.3 %
Eosinophils Absolute: 296 {cells}/uL (ref 15–500)
Eosinophils Relative: 4.7 %
HCT: 38.6 % (ref 35.0–45.0)
Hemoglobin: 13.1 g/dL (ref 11.7–15.5)
Lymphs Abs: 1991 {cells}/uL (ref 850–3900)
MCH: 31.5 pg (ref 27.0–33.0)
MCHC: 33.9 g/dL (ref 32.0–36.0)
MCV: 92.8 fL (ref 80.0–100.0)
MPV: 10 fL (ref 7.5–12.5)
Monocytes Relative: 9 %
Neutro Abs: 3427 {cells}/uL (ref 1500–7800)
Neutrophils Relative %: 54.4 %
Platelets: 283 10*3/uL (ref 140–400)
RBC: 4.16 10*6/uL (ref 3.80–5.10)
RDW: 12 % (ref 11.0–15.0)
Total Lymphocyte: 31.6 %
WBC: 6.3 10*3/uL (ref 3.8–10.8)

## 2022-12-10 LAB — TSH: TSH: 1.55 m[IU]/L

## 2022-12-10 LAB — COMPREHENSIVE METABOLIC PANEL
AG Ratio: 1.5 (calc) (ref 1.0–2.5)
ALT: 29 U/L (ref 6–29)
AST: 20 U/L (ref 10–35)
Albumin: 4.3 g/dL (ref 3.6–5.1)
Alkaline phosphatase (APISO): 88 U/L (ref 31–125)
BUN: 13 mg/dL (ref 7–25)
CO2: 25 mmol/L (ref 20–32)
Calcium: 9.2 mg/dL (ref 8.6–10.2)
Chloride: 102 mmol/L (ref 98–110)
Creat: 0.79 mg/dL (ref 0.50–0.99)
Globulin: 2.9 g/dL (ref 1.9–3.7)
Glucose, Bld: 94 mg/dL (ref 65–99)
Potassium: 3.6 mmol/L (ref 3.5–5.3)
Sodium: 138 mmol/L (ref 135–146)
Total Bilirubin: 0.3 mg/dL (ref 0.2–1.2)
Total Protein: 7.2 g/dL (ref 6.1–8.1)

## 2022-12-10 LAB — HEMOGLOBIN A1C
Hgb A1c MFr Bld: 6 %{Hb} — ABNORMAL HIGH (ref <5.7)
Mean Plasma Glucose: 126 mg/dL
eAG (mmol/L): 7 mmol/L

## 2022-12-27 ENCOUNTER — Ambulatory Visit (AMBULATORY_SURGERY_CENTER): Payer: BC Managed Care – PPO

## 2022-12-27 VITALS — Ht 66.0 in | Wt 230.0 lb

## 2022-12-27 DIAGNOSIS — Z1211 Encounter for screening for malignant neoplasm of colon: Secondary | ICD-10-CM

## 2022-12-27 DIAGNOSIS — Z800 Family history of malignant neoplasm of digestive organs: Secondary | ICD-10-CM

## 2022-12-27 MED ORDER — NA SULFATE-K SULFATE-MG SULF 17.5-3.13-1.6 GM/177ML PO SOLN: 1.0000 | Freq: Once | ORAL | 0 refills | Status: AC

## 2022-12-27 NOTE — Progress Notes (Signed)
No egg or soy allergy known to patient  No issues known to pt with past sedation with any surgeries or procedures Patient denies ever being told they had issues or difficulty with intubation  No FH of Malignant Hyperthermia Pt is not on diet pills Pt is not on  home 02  Pt is not on blood thinners  Pt denies issues with constipation  No A fib or A flutter Have any cardiac testing pending--no  LOA: independent  Prep: suprep      PV competed with patient. Prep instructions sent via mychart and home address. Goodrx coupon for [facility] provided to use for price reduction if needed.

## 2023-01-10 ENCOUNTER — Encounter: Payer: Self-pay | Admitting: Internal Medicine

## 2023-01-17 ENCOUNTER — Encounter: Payer: BC Managed Care – PPO | Admitting: Gastroenterology

## 2023-01-23 ENCOUNTER — Encounter: Payer: BC Managed Care – PPO | Admitting: Internal Medicine

## 2023-02-21 ENCOUNTER — Other Ambulatory Visit: Payer: Self-pay | Admitting: Medical Genetics

## 2023-02-21 DIAGNOSIS — Z006 Encounter for examination for normal comparison and control in clinical research program: Secondary | ICD-10-CM

## 2023-03-27 ENCOUNTER — Other Ambulatory Visit (HOSPITAL_COMMUNITY)
Admission: RE | Admit: 2023-03-27 | Discharge: 2023-03-27 | Disposition: A | Discharge status: Home / Self Care | Payer: BC Managed Care – PPO | Source: Ambulatory Visit | Attending: Oncology | Admitting: Oncology

## 2023-03-27 DIAGNOSIS — Z006 Encounter for examination for normal comparison and control in clinical research program: Secondary | ICD-10-CM

## 2023-04-10 LAB — GENECONNECT MOLECULAR SCREEN: Genetic Analysis Overall Interpretation: NEGATIVE

## 2023-04-17 ENCOUNTER — Other Ambulatory Visit: Payer: Self-pay | Admitting: Family

## 2023-04-20 ENCOUNTER — Inpatient Hospital Stay: Payer: 59 | Attending: Hematology and Oncology | Admitting: Hematology and Oncology

## 2023-04-20 ENCOUNTER — Encounter: Payer: Self-pay | Admitting: Hematology and Oncology

## 2023-04-20 VITALS — BP 143/105 | HR 65 | Temp 98.6°F | Resp 16 | Wt 240.1 lb

## 2023-04-20 DIAGNOSIS — Z79899 Other long term (current) drug therapy: Secondary | ICD-10-CM | POA: Diagnosis not present

## 2023-04-20 DIAGNOSIS — N6092 Unspecified benign mammary dysplasia of left breast: Secondary | ICD-10-CM | POA: Insufficient documentation

## 2023-04-20 DIAGNOSIS — Z800 Family history of malignant neoplasm of digestive organs: Secondary | ICD-10-CM | POA: Diagnosis not present

## 2023-04-20 DIAGNOSIS — N6091 Unspecified benign mammary dysplasia of right breast: Secondary | ICD-10-CM | POA: Insufficient documentation

## 2023-04-20 DIAGNOSIS — Z803 Family history of malignant neoplasm of breast: Secondary | ICD-10-CM | POA: Diagnosis not present

## 2023-04-20 DIAGNOSIS — Z9189 Other specified personal risk factors, not elsewhere classified: Secondary | ICD-10-CM

## 2023-04-20 NOTE — Progress Notes (Signed)
[facility] CONSULT NOTE  Patient Care Team: Olive Bass, FNP as PCP - General (Internal Medicine)  CHIEF COMPLAINTS/PURPOSE OF CONSULTATION:  Atypical ductal hyperplasia.  ASSESSMENT & PLAN:  This is a very pleasant 48 year old female patient who was initially seen by atypical ductal hyperplasia back in June and high risk breast clinic who is here for follow-up after bilateral lumpectomy.   Weight Management Patient previously on Wegovy for weight loss, but discontinued due to insurance coverage. Some weight regain noted.  -Advise patient to check with primary care provider and insurance about coverage for weight loss medications.  Breast Cancer Surveillance No new changes in breasts. PE unremarkable.Current plan is for alternating mammograms and MRIs annually, with twice-yearly clinical breast exams alternating with [name] and gynecology. -Order breast MRI and mammogram. -Advise patient to have clinical breast exam with gynecologist in 6 months. -Follow-up in 1 year, or sooner if imaging shows any concerning findings.  Thank you for consulting [name] in the care of this patient.  Please do not hesitate to contact us with any additional questions or concerns.  HISTORY OF PRESENTING ILLNESS:  Tanya Raymond 48 y.o. female is here because of ADH   Tanya Raymond is a very pleasant 48 yr old female patient with PMH significant for HTN, depression referred to hematology for abnormal mammogram.  Screening mammogram 06/09/2020 IMPRESSION: Further evaluation is suggested for possible mass with calcifications in the right breast.   Further evaluation is suggested for possible mass with calcifications in the left breast.  07/01/20  Diagnostic mammogram : Indeterminate right breast calcifications forming 2-3 distinct groups in the upper outer quadrant. Recommendation is for stereotactic biopsy of the largest group of calcifications. If these demonstrate benign  pathology, additional six-month follow-up is recommended for the additional calcifications. 2. Probably benign left breast calcifications. If above pathology demonstrates benign findings, additional six-month follow-up is recommended.  07/03/2020  Right breast needle core biopsy showed ADH  07/15/2020  Other biopsies showed atypical apocrine hyperplasia and fibrocystic changes.  She had right and left breast lumpectomy on 10/28/2020 by Dr Carolynne Edouard. Pathology showed fibrocystic change, extensive biopsy site changes, no atypia or malignancy in left breast. Right breast showed fibrocystic change with apocrine metaplasia, complex sclerosing lesion, pseudoangiomatous stromal hyperplasia, extensive biopsy site change. No atypia or malignancy.  She had another abnormal mammogram on 06/10/2021 which showed left breast mass. Pathology showed fibrocystic changes, cystic apocrine metaplasia.  Discussed the use of AI scribe software for clinical note transcription with the patient, who gave verbal consent to proceed.  History of Present Illness    Interval history  The patient, with a history of weight loss attempts and breast issues, presents for a routine follow-up. She reports some weight gain after discontinuation of Wegovy due to insurance issues, but continues to exercise regularly. She denies any changes in her breasts. The patient also mentions a family history of colon cancer and plans to schedule a colonoscopy. Rest of the pertinent 10 point ROS reviewed and negative  MEDICAL HISTORY:  Past Medical History:  Diagnosis Date   Anxiety    Family history of breast cancer 09/10/2020   Family history of colon cancer 09/10/2020   Family history of prostate cancer 09/10/2020   FH: ovarian cancer 09/10/2020   Hypertension     SURGICAL HISTORY: Past Surgical History:  Procedure Laterality Date   BREAST BIOPSY Left 03/19/2019    FIBROCYSTIC CHANGES WITH CALCIFICATIONS    BREAST LUMPECTOMY WITH  RADIOACTIVE SEED LOCALIZATION  Bilateral 10/28/2020   Procedure: BILATERAL BREAST LUMPECTOMY WITH RADIOACTIVE SEED LOCALIZATION x2 RIGHT AND X1 LEFT;  Surgeon: Griselda Miner, MD;  Location: [facility];  Service: General;  Laterality: Bilateral;   OTHER SURGICAL HISTORY     IVF   WISDOM TOOTH EXTRACTION      SOCIAL HISTORY: Social History   Socioeconomic History   Marital status: Married    Spouse name: Not on file   Number of children: Not on file   Years of education: Not on file   Highest education level: Professional school degree (e.g., MD, DDS, DVM, JD)  Occupational History   Not on file  Tobacco Use   Smoking status: Never   Smokeless tobacco: Never  Vaping Use   Vaping status: Never Used  Substance and Sexual Activity   Alcohol use: Yes    Comment: Socially    Drug use: Not Currently   Sexual activity: Yes    Birth control/protection: I.U.D.  Other Topics Concern   Not on file  Social History Narrative   Not on file   Social Drivers of Health   Financial Resource Strain: Low Risk  (12/08/2022)   Overall Financial Resource Strain (CARDIA)    Difficulty of Paying Living Expenses: Not hard at all  Food Insecurity: No Food Insecurity (12/08/2022)   Hunger Vital Sign    Worried About Running Out of Food in the Last Year: Never true    Ran Out of Food in the Last Year: Never true  Transportation Needs: No Transportation Needs (12/08/2022)   PRAPARE - Administrator, Civil Service (Medical): No    Lack of Transportation (Non-Medical): No  Physical Activity: Insufficiently Active (12/08/2022)   Exercise Vital Sign    Days of Exercise per Week: 4 days    Minutes of Exercise per Session: 30 min  Stress: No Stress Concern Present (12/08/2022)   [employer] of Occupational Health - Occupational Stress Questionnaire    Feeling of Stress : Only a little  Social Connections: Moderately Integrated (12/08/2022)   Social Connection and Isolation Panel  [NHANES]    Frequency of Communication with Friends and Family: More than three times a week    Frequency of Social Gatherings with Friends and Family: Once a week    Attends Religious Services: Never    Database administrator or Organizations: Yes    Attends Banker Meetings: Never    Marital Status: Married  Catering manager Violence: Not on file    FAMILY HISTORY: Family History  Problem Relation Age of Onset   Hypertension Mother    Hypertension Father    Breast cancer Paternal Aunt 45   Prostate cancer Paternal Uncle 20       metastatic   Breast cancer Maternal Grandmother 85   Heart attack Maternal Grandfather    Colitis Paternal Grandmother    Heart attack Paternal Grandmother    Cancer Paternal Grandmother 77       unknown type; metastatic; ?colon   Heart attack Paternal Grandfather    Ovarian cancer Cousin 109       maternal cousin   Colon cancer Neg Hx    Esophageal cancer Neg Hx    Rectal cancer Neg Hx    Stomach cancer Neg Hx     ALLERGIES:  has no known allergies.  MEDICATIONS:  Current Outpatient Medications  Medication Sig Dispense Refill   Biotin 56213 MCG TABS Take by mouth.  escitalopram (LEXAPRO) 10 MG tablet TAKE 1 TABLET BY MOUTH EVERY DAY 90 tablet 3   levonorgestrel (MIRENA) 20 MCG/24HR IUD 1 each by Intrauterine route once.     loratadine (CLARITIN) 10 MG tablet Take 10 mg by mouth daily.     losartan-hydrochlorothiazide (HYZAAR) 100-25 MG tablet TAKE 1 TABLET BY MOUTH EVERY DAY 90 tablet 3   MAGNESIUM PO Take by mouth.     Multiple Vitamin (MULTIVITAMIN PO) Take by mouth.     VITAMIN D PO Take by mouth.     No current facility-administered medications for this visit.     PHYSICAL EXAMINATION:  ECOG PERFORMANCE STATUS: 0 - Asymptomatic  Vitals:   04/20/23 0931  BP: (!) 143/105  Pulse: 65  Resp: 16  Temp: 98.6 F (37 C)  SpO2: 97%    Filed Weights   04/20/23 0931  Weight: 240 lb 1.6 oz (108.9 kg)    Physical  Exam Constitutional:      Appearance: Normal appearance.  HENT:     Head: Normocephalic and atraumatic.  Chest:     Comments: Bilateral breast examined.  No palpable masses or regional adenopathy Abdominal:     General: Abdomen is flat. Bowel sounds are normal.     Palpations: Abdomen is soft.  Musculoskeletal:     Cervical back: Normal range of motion and neck supple. No rigidity.  Lymphadenopathy:     Cervical: No cervical adenopathy.  Neurological:     General: No focal deficit present.     Mental Status: She is alert.  Psychiatric:        Mood and Affect: Mood normal.    LABORATORY DATA:  I have reviewed the data as listed Lab Results  Component Value Date   WBC 6.3 12/09/2022   HGB 13.1 12/09/2022   HCT 38.6 12/09/2022   MCV 92.8 12/09/2022   PLT 283 12/09/2022     Chemistry      Component Value Date/Time   NA 138 12/09/2022 1613   K 3.6 12/09/2022 1613   CL 102 12/09/2022 1613   CO2 25 12/09/2022 1613   BUN 13 12/09/2022 1613   CREATININE 0.79 12/09/2022 1613      Component Value Date/Time   CALCIUM 9.2 12/09/2022 1613   ALKPHOS 79 03/05/2021 0859   AST 20 12/09/2022 1613   ALT 29 12/09/2022 1613   BILITOT 0.3 12/09/2022 1613      MRI without any evidence of malignancy.  Routine annual screening with mammography and breast MRI recommended.  Patient is due for her next screening mammogram in March 2023.  RADIOGRAPHIC STUDIES:  I have personally reviewed the radiological images as listed and agreed with the findings in the report. No results found.  All questions were answered. The patient knows to call the clinic with any problems, questions or concerns.  I have spent 30 minutes in the care of the patient, discussed pathology results,  role of annual MRI screening, lifetime risk of breast cancer and other measures for risk reduction   Rachel Moulds, MD 04/20/2023 9:40 AM

## 2023-05-09 ENCOUNTER — Ambulatory Visit: Payer: Self-pay | Admitting: Podiatry

## 2023-05-09 ENCOUNTER — Encounter: Payer: Self-pay | Admitting: Podiatry

## 2023-05-09 ENCOUNTER — Ambulatory Visit (INDEPENDENT_AMBULATORY_CARE_PROVIDER_SITE_OTHER): Payer: 59

## 2023-05-09 ENCOUNTER — Ambulatory Visit: Payer: 59 | Admitting: Sports Medicine

## 2023-05-09 DIAGNOSIS — G5761 Lesion of plantar nerve, right lower limb: Secondary | ICD-10-CM | POA: Diagnosis not present

## 2023-05-09 DIAGNOSIS — M79671 Pain in right foot: Secondary | ICD-10-CM | POA: Diagnosis not present

## 2023-05-09 DIAGNOSIS — S99921A Unspecified injury of right foot, initial encounter: Secondary | ICD-10-CM

## 2023-05-09 DIAGNOSIS — M7751 Other enthesopathy of right foot: Secondary | ICD-10-CM

## 2023-05-09 NOTE — Patient Instructions (Signed)
 VISIT SUMMARY:  Today, we discussed your foot pain and toe misalignment. We reviewed your symptoms and history, and I have outlined a plan to address your concerns.  YOUR PLAN:  -SECOND METATARSOPHALANGEAL JOINT PAIN: This is pain in the joint where your second toe meets your foot. It could be due to a neuroma (a nerve issue) or a plantar plate injury (a ligament issue). We will do an ultrasound to check for these problems and then meet again in 1-2 weeks to discuss the results and consider a cortisone injection if needed.  -HALLUX VALGUS (BUNION): This is a deformity where your big toe leans towards your other toes. Currently, it is not causing pain, but we will keep an eye on it and consider treatment if it starts to cause problems.  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 Stretching and wearing supportive shoes should help. If the pain continues, we might consider custom orthotics.  -GENERAL HEALTH MAINTENANCE: Please continue to wear comfortable, supportive footwear to help prevent your foot conditions from getting worse.  INSTRUCTIONS:  Please schedule an ultrasound to evaluate your second metatarsophalangeal joint. After the ultrasound, schedule a follow-up appointment in 1-2 weeks to discuss the results and potential treatment options.

## 2023-05-09 NOTE — Progress Notes (Signed)
 Subjective:  Patient ID: Tanya Raymond, female    DOB: 1975/08/06,  MRN: [id number]  Chief Complaint  Patient presents with   Foot Pain    Patient states that she has some pain on the bottom of her right foot under her 2nd and 3rd toe, and her 2nd and 3rd toe are separating from each other,it doesn't hurt any more like it did 3 months ago. No medication for pain.    Discussed the use of AI scribe software for clinical note transcription with the patient, who gave verbal consent to proceed.  History of Present Illness   Tanya Raymond is a 48 year old female who presents with foot pain and toe misalignment.  Approximately three months ago, she began experiencing pain under the pad of her foot, which has since subsided. Recently, she observed her second toe moving towards and under her big toe, raising concerns about a crossover toe. There was no specific incident, such as trauma or increased activity, that triggered the pain. She has a history of extensive walking during her seven years in [place] and previous ankle issues, but no prior underfoot pain.  Currently, she does not experience pain in her bunion but feels pain when pressure is applied between her toes, particularly in the second toe joint. No numbness or tingling in her toes is reported. Mild pain occurs with certain foot movements, but it is not unbearable.  She has super flat feet and typically wears Birkenstock sandals most of the year. She previously tried orthotics but disliked them. She also notes having a heel spur that causes mild discomfort, but it is not severe.          Objective:    Physical Exam   MUSCULOSKELETAL: Right foot warm and well perfused. Hallux valgus deformity with hypermobility of the first metatarsophalangeal joint. Good range of motion of the metatarsophalangeal joint. No pain on bunion medially. Diffuse callus under the second metatarsal. Pain in the capsule of the dorsal second  metatarsophalangeal joint. No pain or palpation in the interdigital sulcus space. Floretta sign of the second and third toes. No gross instability, negative Lachman's test, no dislocation or bruising. No Mulder's click or radiating pain into the toes.       No images are attached to the encounter.    Results   RADIOLOGY Foot X-ray: Hallux valgus deformity, slightly elongated second ray, medial deviation of the 2nd toe at the 2nd MTP joint. (05/09/2023)      Assessment:   1. Foot pain, right      Plan:  Patient was evaluated and treated and all questions answered.  Assessment and Plan    Second Metatarsophalangeal Joint Pain Pain localized to the dorsal capsule of the second MTP joint with a history of pain under the pad of the foot. Possible neuroma or plantar plate injury. No gross instability or Mulder's click. -Order ultrasound to evaluate for neuroma and assess integrity of plantar plate and lateral ligament. -Schedule follow-up appointment 1-2 weeks post-ultrasound to discuss results and potential cortisone injection if indicated.  Hallux Valgus (Bunion) Noted deformity with hypermobility of the first tarsometatarsal joint, but no current pain or discomfort. -Monitor bunion and consider future intervention if it becomes symptomatic or contributes to second MTP joint issues.  Heel Pain Patient reports mild discomfort. Noted heel spur and Haglund deformity. -Recommend stretching and supportive footwear to alleviate discomfort. -Consider future custom orthotics if symptoms persist.  General Health Maintenance -Encourage use of comfortable,  supportive footwear to prevent exacerbation of foot conditions.          Return for after ultrasound to review.

## 2023-05-10 IMAGING — MR MR BREAST BILAT WO/W CM
6 of 9 series · 29 of 48 positions shown · IV contrast (10 GADAVIST)
Comparison: Previous exam(s).

CLINICAL DATA: 45-year-old female presenting for high risk
screening MRI. Elevated lifetime risk of breast cancer greater than
30%. The patient is status post bilateral excisions for atypia.

LABS:  None performed on site.
EXAM:
BILATERAL BREAST MRI WITH AND WITHOUT CONTRAST
TECHNIQUE: Multiplanar, multisequence MR images of both breasts were obtained
prior to and following the intravenous administration of 10 ml of
Gadavist.

[Series 2: T2 · axial · 3.0mm · 0.91mm/px · 1 of 64 slices shown]
[im 1/64]
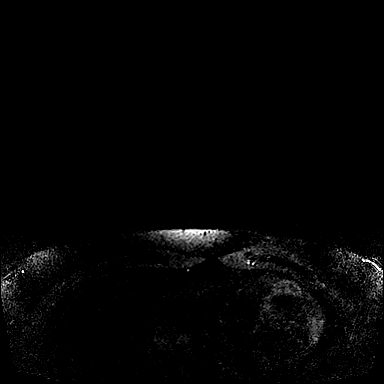

[Series 3: T1 fat-sat · axial · 1.2mm · 0.78mm/px · z∈[-166,+44]mm · 5 of 173 slices shown (1 of 4)]
[im 1/173]
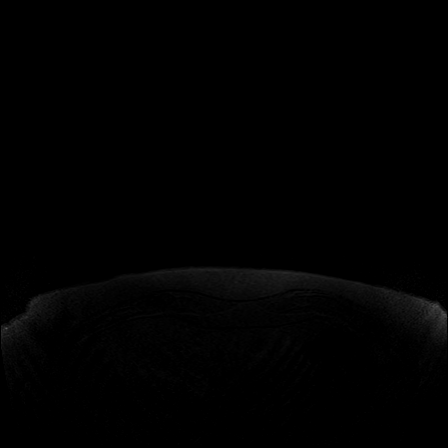
[im 44/173]
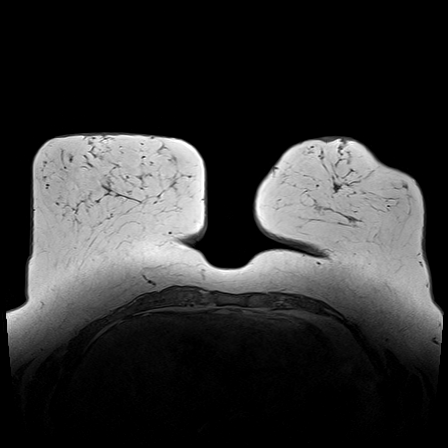
[im 87/173]
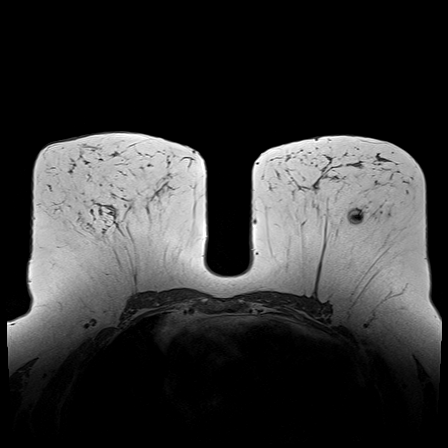
[im 130/173]
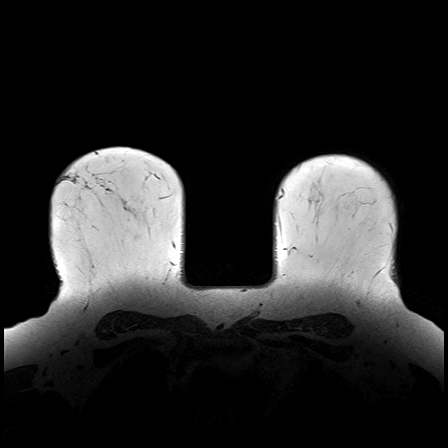
[im 173/173]
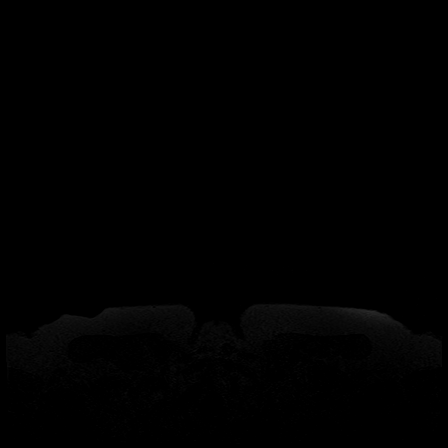

[Series 5: T1 fat-sat · axial · 1.2mm · 0.84mm/px · z∈[-156,+35]mm · 6 of 159 slices shown (2 of 4)]
[im 1/159]
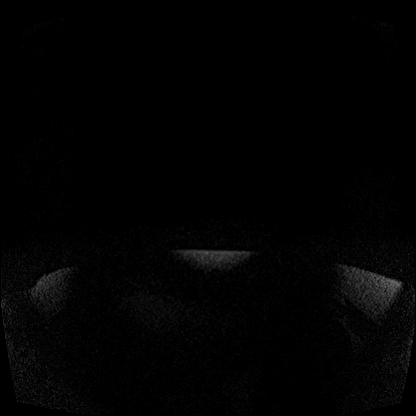
[im 32/159]
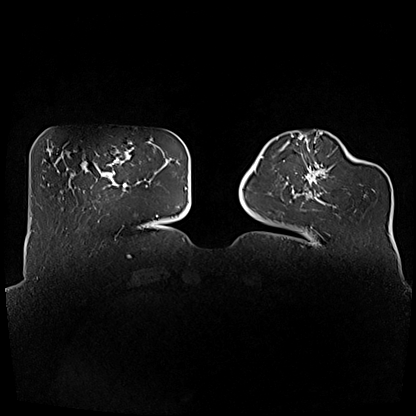
[im 64/159]
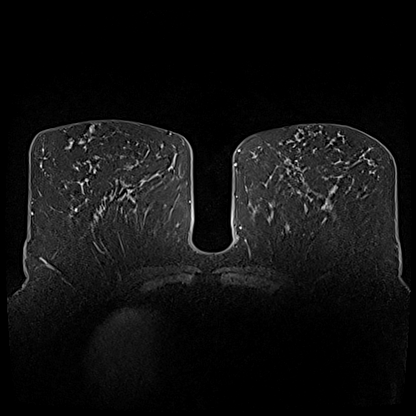
[im 95/159]
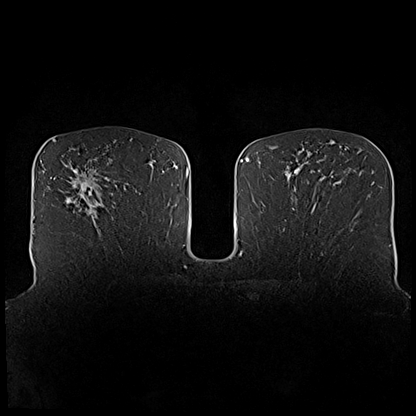
[im 127/159]
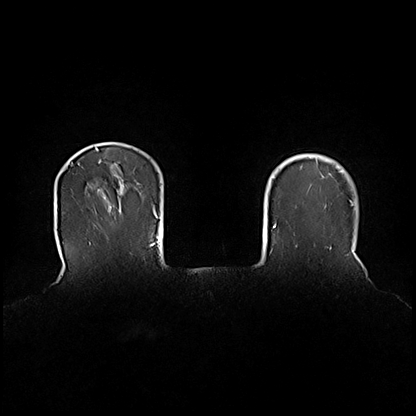
[im 159/159]
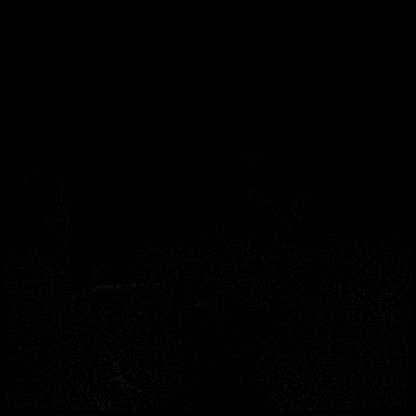

[Series 6: T1 fat-sat · axial · 1.2mm · 0.84mm/px · z∈[-156,+35]mm · 6 of 159 slices shown (3 of 4)]
[im 1/159]
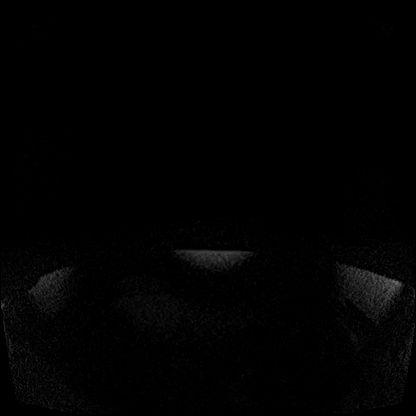
[im 32/159]
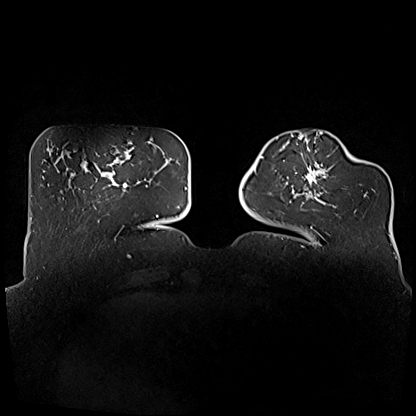
[im 64/159]
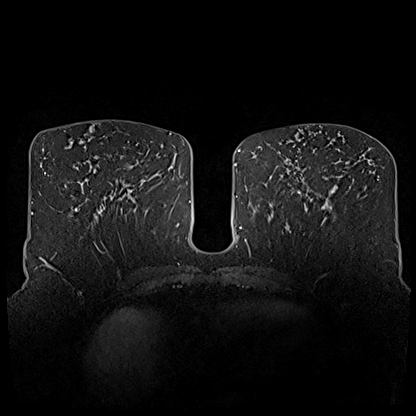
[im 95/159]
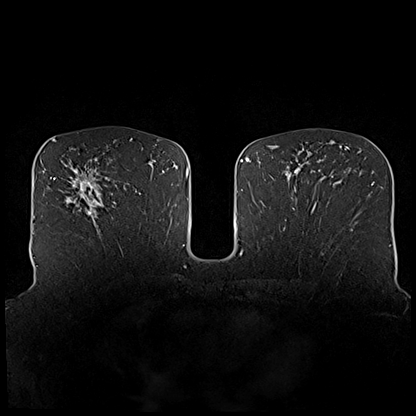
[im 127/159]
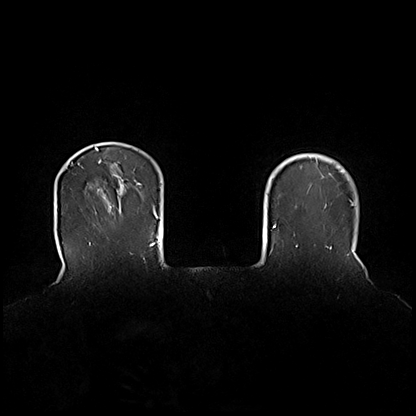
[im 159/159]
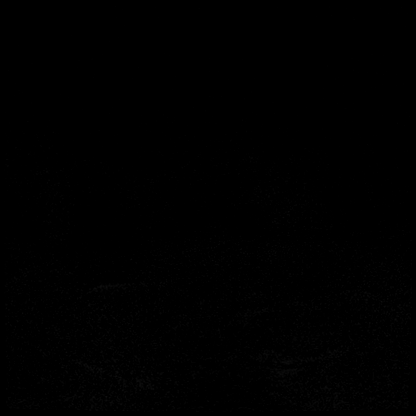

[Series 7: T1 · axial · 1.2mm · 0.84mm/px · z∈[-156,+35]mm · 6 of 160 slices shown]
[im 1/160]
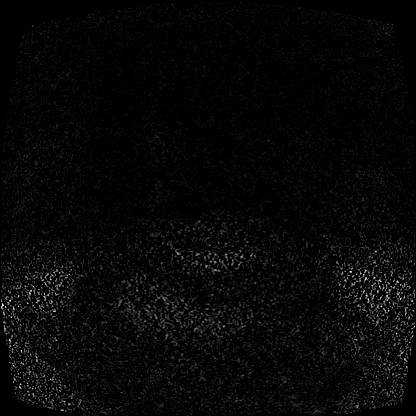
[im 32/160]
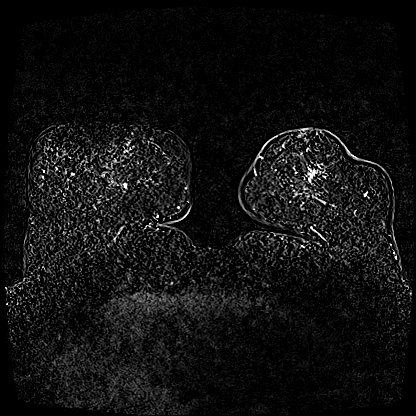
[im 64/160]
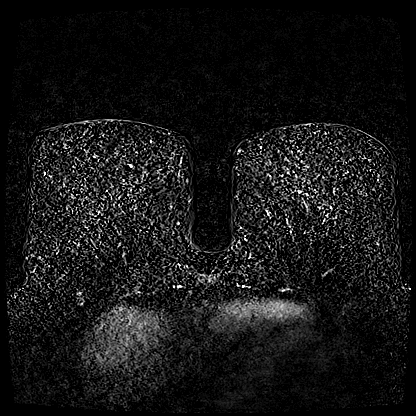
[im 96/160]
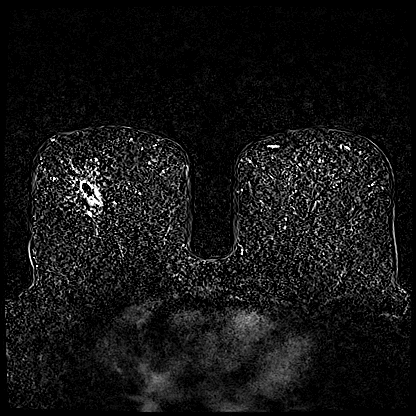
[im 128/160]
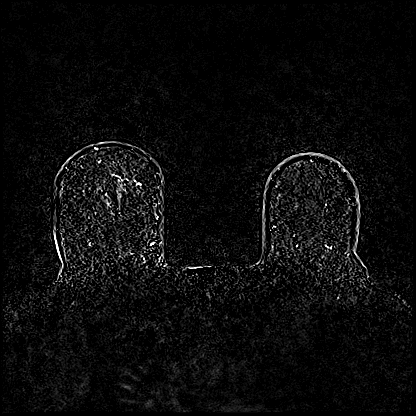
[im 160/160]
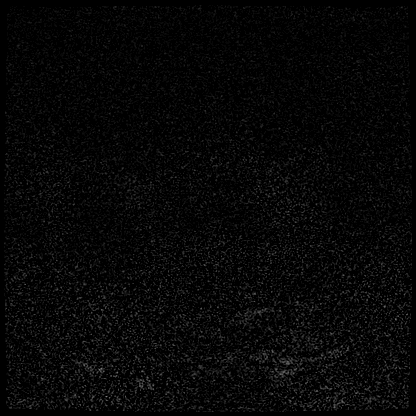

[Series 10: T1 fat-sat · axial · 1.2mm · 0.84mm/px · z∈[-156,-4]mm · 5 of 159 slices shown (4 of 4)]
[im 1/159]
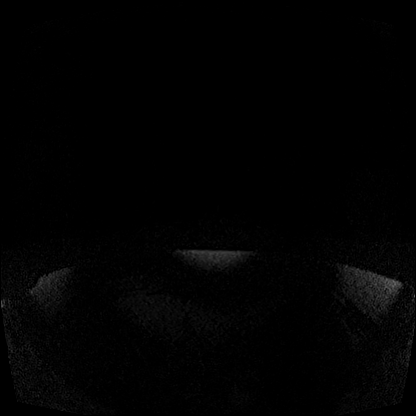
[im 32/159]
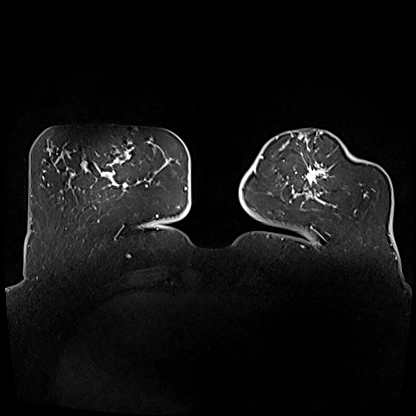
[im 64/159]
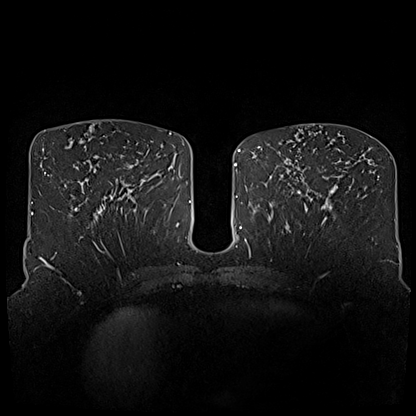
[im 95/159]
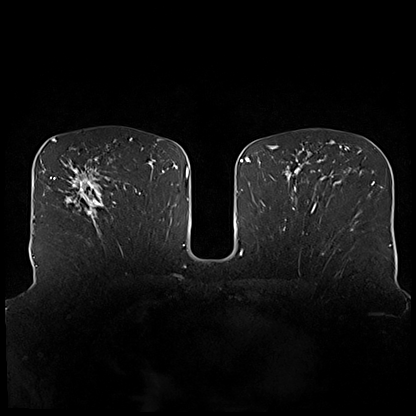
[im 127/159]
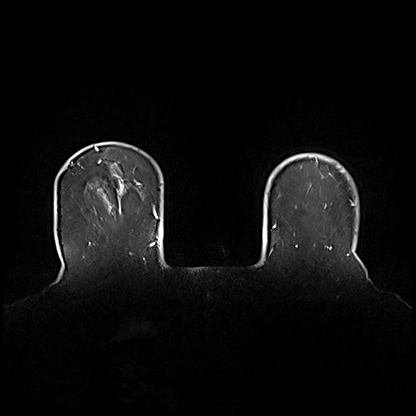

[29 of 48 positions shown; findings below may reference images not displayed]

Three-dimensional MR images were rendered by post-processing of the
original MR data on an independent workstation. The
three-dimensional MR images were interpreted, and findings are
reported in the following complete MRI report for this study. Three
dimensional images were evaluated at the independent interpreting
workstation using the DynaCAD thin client.
FINDINGS: Breast composition: b. Scattered fibroglandular tissue.

Background parenchymal enhancement: Mild. Note is made of numerous
2-3 mm enhancing foci, scattered throughout the bilateral breasts.

Right breast: No suspicious mass or abnormal enhancement.
Postsurgical changes are demonstrated in the upper outer quadrant of
the right breast. Peripheral smooth, linear enhancement is most
consistent with fat necrosis.

Left breast: No suspicious mass or abnormal enhancement.
Postsurgical changes are noted in the lower outer quadrant of the
left breast.

Lymph nodes: No abnormal appearing lymph nodes.

Ancillary findings:  None.
IMPRESSION: Bilateral post surgical changes without MRI evidence of malignancy.

RECOMMENDATION:
Routine annual screening with mammography and breast MRI. The
patient is due for her next screening mammogram in June 2021.

BI-RADS CATEGORY  2: Benign.

## 2023-05-16 ENCOUNTER — Telehealth: Payer: Self-pay

## 2023-05-16 NOTE — Telephone Encounter (Signed)
Order for U/S, office note and demographics faxed to [facility] on [address] [phone] Fax [phone]

## 2023-05-16 NOTE — Telephone Encounter (Signed)
-----   Message from Edwin Cap sent at 05/09/2023  3:44 PM EST ----- Can you send his ultrasound referral to [facility] on [address]?  Thanks

## 2023-06-06 ENCOUNTER — Encounter: Payer: Self-pay | Admitting: Podiatry

## 2023-06-09 ENCOUNTER — Ambulatory Visit
Admission: RE | Admit: 2023-06-09 | Discharge: 2023-06-09 | Disposition: A | Discharge status: Home / Self Care | Payer: 59 | Source: Ambulatory Visit | Attending: Hematology and Oncology | Admitting: Hematology and Oncology

## 2023-06-09 DIAGNOSIS — Z803 Family history of malignant neoplasm of breast: Secondary | ICD-10-CM

## 2023-06-09 DIAGNOSIS — Z9189 Other specified personal risk factors, not elsewhere classified: Secondary | ICD-10-CM

## 2023-06-12 ENCOUNTER — Encounter: Payer: Self-pay | Admitting: Podiatry

## 2023-06-12 ENCOUNTER — Ambulatory Visit: Payer: 59 | Admitting: Podiatry

## 2023-06-12 VITALS — Ht 66.0 in | Wt 240.1 lb

## 2023-06-12 DIAGNOSIS — S99921A Unspecified injury of right foot, initial encounter: Secondary | ICD-10-CM

## 2023-06-12 DIAGNOSIS — G5761 Lesion of plantar nerve, right lower limb: Secondary | ICD-10-CM

## 2023-06-12 DIAGNOSIS — M7741 Metatarsalgia, right foot: Secondary | ICD-10-CM

## 2023-06-12 DIAGNOSIS — M21611 Bunion of right foot: Secondary | ICD-10-CM | POA: Diagnosis not present

## 2023-06-12 DIAGNOSIS — M2011 Hallux valgus (acquired), right foot: Secondary | ICD-10-CM | POA: Diagnosis not present

## 2023-06-12 DIAGNOSIS — M7742 Metatarsalgia, left foot: Secondary | ICD-10-CM

## 2023-06-14 ENCOUNTER — Encounter: Payer: 59 | Admitting: Internal Medicine

## 2023-06-14 NOTE — Progress Notes (Signed)
  Subjective:  Patient ID: Tanya Raymond, female    DOB: 01/13/1976,  MRN: [id number]  Chief Complaint  Patient presents with   Foot Pain    Pt is here to f/u on right foot, pt states everything is going well with foot.     She returns for follow-up pain is fairly tolerable.  She completed the ultrasound         Objective:    Physical Exam   MUSCULOSKELETAL: Pain in the dorsal capsule has improved, no major pain to palpation of the plantar second or third interspace no gross laxity today       Results   RADIOLOGY Foot X-ray: Hallux valgus deformity, slightly elongated second ray, medial deviation of the 2nd toe at the 2nd MTP joint. (05/09/2023)      Assessment:   Encounter Diagnoses  Name Primary?   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  We reviewed the results of the ultrasound discussed there is partial tearing and a neuroma.  The neuroma is fairly tolerable and relatively asymptomatic, her ultrasound showed only partial tearing of the plantar plate and I recommended nonsurgical treatment for this.  Budin splint was dispensed.  We discussed custom molded foot orthoses and custom sandals and she was scheduled to see the orthotist for fitting for these to offload with a metatarsal pad and second MTP unload.  Discussed if symptoms worsen or increase that injection therapy could offer some relief but we will hold off on this for now and allow this to heal.  She will let me know if it worsens.

## 2023-06-18 ENCOUNTER — Other Ambulatory Visit: Payer: Self-pay | Admitting: Family

## 2023-06-21 ENCOUNTER — Encounter: Payer: Self-pay | Admitting: Family

## 2023-06-23 ENCOUNTER — Other Ambulatory Visit: Payer: Self-pay | Admitting: Family

## 2023-06-23 DIAGNOSIS — Z1211 Encounter for screening for malignant neoplasm of colon: Secondary | ICD-10-CM

## 2023-07-07 ENCOUNTER — Ambulatory Visit: Admitting: Orthopedic Surgery

## 2023-07-07 ENCOUNTER — Other Ambulatory Visit (INDEPENDENT_AMBULATORY_CARE_PROVIDER_SITE_OTHER): Payer: Self-pay

## 2023-07-07 DIAGNOSIS — M25561 Pain in right knee: Secondary | ICD-10-CM

## 2023-07-08 ENCOUNTER — Encounter: Payer: Self-pay | Admitting: Orthopedic Surgery

## 2023-07-08 NOTE — Progress Notes (Signed)
 Office Visit Note   Patient: Tanya Raymond           Date of Birth: December 04, 1975           MRN: [id number] Visit Date: 07/07/2023 Requested by: Olive Bass, FNP [address] [place],  [place] [address] PCP: Olive Bass, FNP  Subjective: Chief Complaint  Patient presents with   Right Knee - Pain    HPI: Tanya Raymond is a 48 y.o. female who presents to the office reporting right knee pain following a fall 6 months ago.  That actually got better.  She had a recurrent fall 3 weeks ago which was more twisting.  Initial injury was impact.  Now her pain is getting a little bit worse.  She played sports into her early 19s.  Did not really have any swelling with the either of these injuries.  Describes pain anterolateral anterior medial as well as posterior lateral.  Denies any instability in the knee.  She does do sedentary work.  Likes to walk about 2 miles around lunchtime.  That is painful at this time.  She really cannot do any ladder work or gardening.  Ibuprofen helps some.  Hurts for her to walk long distances.  No prior surgery on that right knee..                ROS: All systems reviewed are negative as they relate to the chief complaint within the history of present illness.  Patient denies fevers or chills.  Assessment & Plan: Visit Diagnoses:  1. Right knee pain, unspecified chronicity     Plan: Impression is right knee pain.  No effusion today.  I instructed her in a home exercise program of quad strengthening and stretching.  This would be done in a nonweightbearing manner.  She has also already been doing some exercises and activity modification to try to improve her knee.  The twisting component of the injury makes meniscal pathology possible.  Plan at this time is 3 weeks communication via MyChart and we will consider MRI scanning if no better.  Continue with the nonweightbearing quad strengthening physical therapy type  exercises.  Follow-Up Instructions: No follow-ups on file.   Orders:  Orders Placed This Encounter  Procedures   XR KNEE 3 VIEW RIGHT   No orders of the defined types were placed in this encounter.     Procedures: No procedures performed   Clinical Data: No additional findings.  Objective: Vital Signs: LMP  (LMP Unknown)   Physical Exam:  Constitutional: Patient appears well-developed HEENT:  Head: Normocephalic Eyes:EOM are normal Neck: Normal range of motion Cardiovascular: Normal rate Pulmonary/chest: Effort normal Neurologic: Patient is alert Skin: Skin is warm Psychiatric: Patient has normal mood and affect  Ortho Exam: Ortho exam demonstrates full range of motion of the right knee with very mild focal medial and lateral joint line tenderness.  Murray compression testing is negative.  No groin pain with internal/external rotation of the leg.  No masses lymphadenopathy or skin changes noted in that right knee region.  Collateral and cruciate ligaments are stable.  Pedal pulses palpable.  Specialty Comments:  No specialty comments available.  Imaging: No results found.   PMFS History: Patient Active Problem List   Diagnosis Date Noted   Genetic testing 09/18/2020   Family history of breast cancer 09/10/2020   FH: ovarian cancer 09/10/2020   Family history of prostate cancer 09/10/2020   Family history  of colon cancer 09/10/2020   Past Medical History:  Diagnosis Date   Anxiety    Family history of breast cancer 09/10/2020   Family history of colon cancer 09/10/2020   Family history of prostate cancer 09/10/2020   FH: ovarian cancer 09/10/2020   Hypertension     Family History  Problem Relation Age of Onset   Hypertension Mother    Hypertension Father    Breast cancer Paternal Aunt 58   Prostate cancer Paternal Uncle 9       metastatic   Breast cancer Maternal Grandmother 64   Heart attack Maternal Grandfather    Colitis Paternal Grandmother     Heart attack Paternal Grandmother    Cancer Paternal Grandmother 30       unknown type; metastatic; ?colon   Heart attack Paternal Grandfather    Ovarian cancer Cousin 33       maternal cousin   Colon cancer Neg Hx    Esophageal cancer Neg Hx    Rectal cancer Neg Hx    Stomach cancer Neg Hx     Past Surgical History:  Procedure Laterality Date   BREAST BIOPSY Left 03/19/2019    FIBROCYSTIC CHANGES WITH CALCIFICATIONS    BREAST LUMPECTOMY WITH RADIOACTIVE SEED LOCALIZATION Bilateral 10/28/2020   Procedure: BILATERAL BREAST LUMPECTOMY WITH RADIOACTIVE SEED LOCALIZATION x2 RIGHT AND X1 LEFT;  Surgeon: Griselda Miner, MD;  Location: [facility];  Service: General;  Laterality: Bilateral;   OTHER SURGICAL HISTORY     IVF   WISDOM TOOTH EXTRACTION     Social History   Occupational History   Not on file  Tobacco Use   Smoking status: Never   Smokeless tobacco: Never  Vaping Use   Vaping status: Never Used  Substance and Sexual Activity   Alcohol use: Yes    Comment: Socially    Drug use: Not Currently   Sexual activity: Yes    Birth control/protection: I.U.D.

## 2023-07-24 ENCOUNTER — Ambulatory Visit

## 2023-07-24 NOTE — Progress Notes (Signed)
 Orthotics   Patient was present and evaluated for Custom molded foot orthotics. Patient will benefit from CFO's to provide total contact to BIL MLA's helping to balance and distribute body weight more evenly across BIL feet helping to reduce plantar pressure and pain. Orthotic will also encourage FF / RF alignment  Patient was scanned today and will return for fitting upon receipt  Tanya Raymond Cped, CFo, CFm

## 2023-07-27 ENCOUNTER — Encounter: Payer: Self-pay | Admitting: Family

## 2023-07-27 ENCOUNTER — Other Ambulatory Visit: Payer: Self-pay | Admitting: Family

## 2023-07-27 ENCOUNTER — Encounter: Payer: Self-pay | Admitting: Orthopedic Surgery

## 2023-07-27 DIAGNOSIS — Z1211 Encounter for screening for malignant neoplasm of colon: Secondary | ICD-10-CM

## 2023-07-27 NOTE — Telephone Encounter (Signed)
 Based on the last note from Dr Rozelle Corning, she needs MRI of her right knee

## 2023-07-27 NOTE — Telephone Encounter (Signed)
 Agree.  She has failed physical therapy for at least 6 weeks and has continued pain.  Please order MRI scan.  Thanks

## 2023-07-28 ENCOUNTER — Other Ambulatory Visit: Payer: Self-pay

## 2023-07-28 DIAGNOSIS — M25561 Pain in right knee: Secondary | ICD-10-CM

## 2023-07-31 ENCOUNTER — Encounter: Payer: Self-pay | Admitting: Orthopedic Surgery

## 2023-08-18 ENCOUNTER — Ambulatory Visit
Admission: RE | Admit: 2023-08-18 | Discharge: 2023-08-18 | Disposition: A | Discharge status: Home / Self Care | Source: Ambulatory Visit | Attending: Orthopedic Surgery | Admitting: Orthopedic Surgery

## 2023-08-18 DIAGNOSIS — M25561 Pain in right knee: Secondary | ICD-10-CM

## 2023-08-23 ENCOUNTER — Telehealth: Payer: Self-pay

## 2023-08-23 NOTE — Telephone Encounter (Signed)
 LVM to resched 6/10 appt provider out of office

## 2023-08-26 ENCOUNTER — Ambulatory Visit
Admission: RE | Admit: 2023-08-26 | Discharge: 2023-08-26 | Disposition: A | Discharge status: Home / Self Care | Source: Ambulatory Visit | Attending: Orthopedic Surgery | Admitting: Orthopedic Surgery

## 2023-08-29 ENCOUNTER — Ambulatory Visit: Payer: Self-pay | Admitting: Orthopedic Surgery

## 2023-08-29 NOTE — Progress Notes (Signed)
 I called.  Meniscal tear and Baker's cyst in knee.  Can you have her come in this week sometime for aspiration and injection of the knee.  I think she is likely heading for surgery.  Thanks

## 2023-08-31 ENCOUNTER — Encounter: Payer: Self-pay | Admitting: Orthopedic Surgery

## 2023-08-31 NOTE — Progress Notes (Signed)
 Appt made with [facility] for tomorrow afternoon at 1:30p.

## 2023-09-01 ENCOUNTER — Ambulatory Visit: Admitting: Surgical

## 2023-09-01 DIAGNOSIS — M23006 Cystic meniscus, unspecified meniscus, right knee: Secondary | ICD-10-CM | POA: Diagnosis not present

## 2023-09-01 DIAGNOSIS — S83241D Other tear of medial meniscus, current injury, right knee, subsequent encounter: Secondary | ICD-10-CM

## 2023-09-03 ENCOUNTER — Encounter: Payer: Self-pay | Admitting: Surgical

## 2023-09-03 NOTE — Progress Notes (Signed)
 Office Visit Note   Patient: Tanya Raymond           Date of Birth: 01-20-1976           MRN: [id number] Visit Date: 09/01/2023 Requested by: Adra Alanis, FNP [address] [place],  [place] [address] PCP: Adra Alanis, FNP  Subjective: Chief Complaint  Patient presents with   Right Knee - Pain    HPI: Tanya Raymond is a 48 y.o. female who presents to the office for MRI review. Patient denies any changes in symptoms.  Continues to complain mainly of intermittent right knee pain.  Pain is primarily in the posterior and posterior medial aspect of the right knee.  She has pain that bothers her most days but some days she does not really have much discomfort.  Takes occasional ibuprofen and Tylenol  with good relief.  She denies any significant mechanical symptoms.  She has no numbness or tingling in the right leg.  Recently spoke with Dr. Rozelle Corning over the phone and is here today for right knee injection but she is actually considering surgery after discussion with him.  MRI results revealed: MR Knee Right w/o contrast Result Date: 08/29/2023 MR KNEE WITHOUT IV CONTRAST RIGHT COMPARISON: None. CLINICAL HISTORY: Right knee pain. PULSE SEQUENCES: Ax PD FS, Sag T2 ACL, Sag PD FS, Cor PD FS & COR T1 FINDINGS: Bones: There is tricompartmental osteoarthrosis with osteophyte formation and mild chondromalacia. There is no focal full-thickness cartilage defect identified. Mild degenerative subchondral cystic changes are seen in the posterior tibial plateau and central tibial plateau. Small reactive joint effusion and small Baker's cyst identified. There is a lobulated cystic structure adjacent to the root of the posterior horn of the medial meniscus and medial to the popliteal neurovascular bundle in the posterior knee measuring 1.6 x 3.2 cm in size. This could be a ganglion cyst or para-meniscal cyst. Ligaments: The ACL, PCL, MCL and fibular collateral  ligament are intact. Menisci: Lateral meniscus is unremarkable. There is a moderate-sized horizontal tear of the posterior horn and body of the medial meniscus. No displaced meniscal tear is present. 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 Small reactive joint effusion. Horizontal tear of the posterior horn and body of the medial meniscus. See above for more detail. There is a lobulated cystic structure adjacent to the root of the posterior horn of the medial meniscus and medial to the popliteal neurovascular bundle in the posterior knee measuring 1.6 x 3.2 cm in size. This could be a ganglion cyst or para-meniscal cyst. Electronically signed by: Adrien Alberta MD 08/29/2023 10:15 AM EDT RP Workstation: [id number]                 ROS: All systems reviewed are negative as they relate to the chief complaint within the history of present illness.  Patient denies fevers or chills.  Assessment & Plan: Visit Diagnoses:  1. Acute medial meniscus tear of right knee, subsequent encounter   2. Perimeniscal cyst of right knee     Plan: Tanya Raymond is a 48 y.o. female who presents to the office for review of right knee MRI scan.  MRI demonstrates mild early degenerative changes in all 3 compartments but also demonstrates medial meniscus tear with ganglion versus parameniscal cyst in the posterior aspect of the knee.  We discussed possibility of trying aspiration/injection today but she would like to have definitive treatment of her symptoms.  Best option for  her would be to have arthroscopic debridement versus repair of meniscus with debridement of posterior cyst.  We will get a better idea of how much arthritis is present from the arthroscopy images.  We discussed the risks and benefits of the procedure including the risk of nerve/blood vessel damage (especially with the location of the cyst), need for revision knee surgery in the future, continued knee  stiffness and discomfort, medical complication from surgery such as DVT/PE, etc.  Patient would still like to proceed.  She does have history of cardiac syndrome that she has had evaluation for before but has no recent evaluation with cardiology.  She will see cardiologist prior to surgery.  She has no personal family history of DVT/PE.  She denies any smoking or diabetes history.  Follow-up after procedure.  Follow-Up Instructions: No follow-ups on file.   Orders:  No orders of the defined types were placed in this encounter.  No orders of the defined types were placed in this encounter.     Procedures: No procedures performed   Clinical Data: No additional findings.  Objective: Vital Signs: There were no vitals taken for this visit.  Physical Exam:  Constitutional: Patient appears well-developed HEENT:  Head: Normocephalic Eyes:EOM are normal Neck: Normal range of motion Cardiovascular: Normal rate Pulmonary/chest: Effort normal Neurologic: Patient is alert Skin: Skin is warm Psychiatric: Patient has normal mood and affect  Ortho Exam: Ortho exam demonstrates right knee with 0 degrees extension and greater than 120 degrees of knee flexion.  She has palpable DP pulse.  Intact ankle dorsiflexion and plantarflexion without any weakness compared with contralateral side.  There is no significant tenderness over the lateral joint line.  She does have mild to moderate tenderness over the posterior medial joint line.  Trace effusion is present.  Stable to anterior and posterior drawer sign.  Stable to Lachman exam.  Stable to varus and valgus stress at 0 and 30 degrees.  No pain with hip range of motion.  Able to perform straight leg raise without extensor lag.  Specialty Comments:  No specialty comments available.  Imaging: No results found.   PMFS History: Patient Active Problem List   Diagnosis Date Noted   Genetic testing 09/18/2020   Family history of breast cancer  09/10/2020   FH: ovarian cancer 09/10/2020   Family history of prostate cancer 09/10/2020   Family history of colon cancer 09/10/2020   Past Medical History:  Diagnosis Date   Anxiety    Family history of breast cancer 09/10/2020   Family history of colon cancer 09/10/2020   Family history of prostate cancer 09/10/2020   FH: ovarian cancer 09/10/2020   Hypertension     Family History  Problem Relation Age of Onset   Hypertension Mother    Hypertension Father    Breast cancer Paternal Aunt 85   Prostate cancer Paternal Uncle 54       metastatic   Breast cancer Maternal Grandmother 70   Heart attack Maternal Grandfather    Colitis Paternal Grandmother    Heart attack Paternal Grandmother    Cancer Paternal Grandmother 68       unknown type; metastatic; ?colon   Heart attack Paternal Grandfather    Ovarian cancer Cousin 90       maternal cousin   Colon cancer Neg Hx    Esophageal cancer Neg Hx    Rectal cancer Neg Hx    Stomach cancer Neg Hx     Past Surgical History:  Procedure Laterality Date   BREAST BIOPSY Left 03/19/2019    FIBROCYSTIC CHANGES WITH CALCIFICATIONS    BREAST LUMPECTOMY WITH RADIOACTIVE SEED LOCALIZATION Bilateral 10/28/2020   Procedure: BILATERAL BREAST LUMPECTOMY WITH RADIOACTIVE SEED LOCALIZATION x2 RIGHT AND X1 LEFT;  Surgeon: Caralyn Chandler, MD;  [facility];  Service: General;  Laterality: Bilateral;   OTHER SURGICAL HISTORY     IVF   WISDOM TOOTH EXTRACTION     Social History   Occupational History   Not on file  Tobacco Use   Smoking status: Never   Smokeless tobacco: Never  Vaping Use   Vaping status: Never Used  Substance and Sexual Activity   Alcohol use: Yes    Comment: Socially    Drug use: Not Currently   Sexual activity: Yes    Birth control/protection: I.U.D.

## 2023-09-12 ENCOUNTER — Other Ambulatory Visit

## 2023-09-19 LAB — HM COLONOSCOPY

## 2023-09-20 ENCOUNTER — Ambulatory Visit: Admitting: Orthopedic Surgery

## 2023-09-28 ENCOUNTER — Encounter: Payer: Self-pay | Admitting: Family

## 2023-10-10 ENCOUNTER — Telehealth: Payer: Self-pay

## 2023-10-10 NOTE — Telephone Encounter (Signed)
 LVM to schedule orthotic pick up

## 2023-11-15 ENCOUNTER — Other Ambulatory Visit

## 2023-11-17 ENCOUNTER — Ambulatory Visit (INDEPENDENT_AMBULATORY_CARE_PROVIDER_SITE_OTHER)

## 2023-11-17 DIAGNOSIS — G5761 Lesion of plantar nerve, right lower limb: Secondary | ICD-10-CM

## 2023-11-17 DIAGNOSIS — M7742 Metatarsalgia, left foot: Secondary | ICD-10-CM | POA: Diagnosis not present

## 2023-11-17 DIAGNOSIS — M2011 Hallux valgus (acquired), right foot: Secondary | ICD-10-CM

## 2023-11-17 DIAGNOSIS — M7751 Other enthesopathy of right foot: Secondary | ICD-10-CM

## 2023-11-17 DIAGNOSIS — S99921A Unspecified injury of right foot, initial encounter: Secondary | ICD-10-CM

## 2023-11-17 DIAGNOSIS — M7741 Metatarsalgia, right foot: Secondary | ICD-10-CM

## 2023-11-20 ENCOUNTER — Other Ambulatory Visit: Payer: Self-pay | Admitting: Surgical

## 2023-11-20 ENCOUNTER — Encounter: Payer: Self-pay | Admitting: Orthopedic Surgery

## 2023-11-20 DIAGNOSIS — S83241D Other tear of medial meniscus, current injury, right knee, subsequent encounter: Secondary | ICD-10-CM | POA: Diagnosis not present

## 2023-11-20 DIAGNOSIS — M23051 Cystic meniscus, posterior horn of lateral meniscus, right knee: Secondary | ICD-10-CM | POA: Diagnosis not present

## 2023-11-20 MED ORDER — CELECOXIB 100 MG PO CAPS: 100.0000 mg | ORAL_CAPSULE | Freq: Two times a day (BID) | ORAL | 0 refills | Status: AC

## 2023-11-20 MED ORDER — ASPIRIN 81 MG PO CHEW: 81.0000 mg | CHEWABLE_TABLET | Freq: Two times a day (BID) | ORAL | 0 refills | Status: AC

## 2023-11-20 MED ORDER — METHOCARBAMOL 500 MG PO TABS: 500.0000 mg | ORAL_TABLET | Freq: Three times a day (TID) | ORAL | 1 refills | Status: AC | PRN

## 2023-11-20 MED ORDER — OXYCODONE HCL 5 MG PO TABS: 5.0000 mg | ORAL_TABLET | ORAL | 0 refills | Status: AC | PRN

## 2023-11-22 ENCOUNTER — Encounter: Payer: Self-pay | Admitting: Family

## 2023-11-23 ENCOUNTER — Telehealth: Payer: Self-pay | Admitting: Orthopedic Surgery

## 2023-11-23 ENCOUNTER — Other Ambulatory Visit

## 2023-11-27 ENCOUNTER — Encounter: Payer: Self-pay | Admitting: Surgical

## 2023-11-27 ENCOUNTER — Ambulatory Visit (INDEPENDENT_AMBULATORY_CARE_PROVIDER_SITE_OTHER): Admitting: Surgical

## 2023-11-27 DIAGNOSIS — Z9889 Other specified postprocedural states: Secondary | ICD-10-CM

## 2023-11-27 NOTE — Progress Notes (Signed)
 Post-Op Visit Note   Patient: Tanya Raymond           Date of Birth: 1975-04-19           MRN: [id number] Visit Date: 11/27/2023 PCP: [name], FNP   Assessment & Plan:  Chief Complaint:  Chief Complaint  Patient presents with   Right Knee - Routine Post Op    11/20/2023 right knee arthroscopy, PMM, meniscal cyst decompression   Visit Diagnoses:  1. S/P right knee arthroscopy     Plan: Patient is a 48 year old female who presents s/p right knee arthroscopy with meniscal debridement and parameniscal cyst decompression in the posterior aspect of the knee on 11/20/2023.  Overall she is doing well.  She feels she is getting better and better.  Really having no significant pain.  She is ambulatory without assistance.  She was able to walk around [employer] over the weekend without any increase in her pain.  No fevers or chills.  No chest pain, shortness of breath, persistent calf pain.  On exam, patient has 0 degrees extension and 115 degrees of knee flexion.  Incisions are healing well with sutures intact.  Sutures removed and replaced Steri-Strips today.  No calf tenderness.  Negative Homans' sign.  Palpable DP pulse.  Intact straight leg raise with good quad strength rated 5/5.  Small effusion present.  Plan at this time is return to work.  She is not taking pain medication.  She will work back to driving slowly as she can tolerate and avoid highway roads.  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  Follow-up in 4 weeks for clinical recheck.  She will do stationary bike for at home rehab.  Follow-Up Instructions: No follow-ups on file.   Orders:  No orders of the defined types were placed in this encounter.  No orders of the defined types were placed in this encounter.   Imaging: No results found.  PMFS History: Patient Active Problem List   Diagnosis Date Noted   Genetic testing 09/18/2020   Family history of breast cancer  09/10/2020   FH: ovarian cancer 09/10/2020   Family history of prostate cancer 09/10/2020   Family history of colon cancer 09/10/2020   Past Medical History:  Diagnosis Date   Anxiety    Family history of breast cancer 09/10/2020   Family history of colon cancer 09/10/2020   Family history of prostate cancer 09/10/2020   FH: ovarian cancer 09/10/2020   Hypertension     Family History  Problem Relation Age of Onset   Hypertension Mother    Hypertension Father    Breast cancer Paternal Aunt 28   Prostate cancer Paternal Uncle 86       metastatic   Breast cancer Maternal Grandmother 41   Heart attack Maternal Grandfather    Colitis Paternal Grandmother    Heart attack Paternal Grandmother    Cancer Paternal Grandmother 60       unknown type; metastatic; ?colon   Heart attack Paternal Grandfather    Ovarian cancer Cousin 22       maternal cousin   Colon cancer Neg Hx    Esophageal cancer Neg Hx    Rectal cancer Neg Hx    Stomach cancer Neg Hx     Past Surgical History:  Procedure Laterality Date   BREAST BIOPSY Left 03/19/2019    FIBROCYSTIC CHANGES WITH CALCIFICATIONS    BREAST LUMPECTOMY WITH RADIOACTIVE  SEED LOCALIZATION Bilateral 10/28/2020   Procedure: BILATERAL BREAST LUMPECTOMY WITH RADIOACTIVE SEED LOCALIZATION x2 RIGHT AND X1 LEFT;  Surgeon: [name] Deward MOULD, MD;  Location: [facility];  Service: General;  Laterality: Bilateral;   OTHER SURGICAL HISTORY     IVF   WISDOM TOOTH EXTRACTION     Social History   Occupational History   Not on file  Tobacco Use   Smoking status: Never   Smokeless tobacco: Never  Vaping Use   Vaping status: Never Used  Substance and Sexual Activity   Alcohol use: Yes    Comment: Socially    Drug use: Not Currently   Sexual activity: Yes    Birth control/protection: I.U.D.

## 2023-12-01 NOTE — Progress Notes (Signed)
 Patient presents today to pick up custom molded foot orthotics, diagnosed with Neuroma, hallux Valgus by Dr. Silva.   Orthotics were dispensed and fit was satisfactory. Reviewed instructions for break-in and wear. Written instructions given to patient.  Patient will follow up as needed.   Lolita Schultze CPed, CFo, Cfm

## 2023-12-14 ENCOUNTER — Other Ambulatory Visit: Payer: Self-pay | Admitting: Surgical

## 2023-12-25 ENCOUNTER — Ambulatory Visit: Admitting: Surgical

## 2023-12-25 DIAGNOSIS — Z9889 Other specified postprocedural states: Secondary | ICD-10-CM

## 2023-12-31 ENCOUNTER — Encounter: Payer: Self-pay | Admitting: Surgical

## 2023-12-31 NOTE — Progress Notes (Signed)
 Post-Op Visit Note   Patient: Tanya Raymond           Date of Birth: 06/21/1975           MRN: [id number] Visit Date: 12/25/2023 PCP: [name], FNP (Inactive)   Assessment & Plan:  Chief Complaint:  Chief Complaint  Patient presents with   Right Knee - Follow-up, Routine Post Op    11/20/2023 right knee arthroscopy, PMM, meniscal cyst debridement   Visit Diagnoses:  1. S/P right knee arthroscopy     Plan: Patient is a 48 year old female who presents s/p right knee arthroscopy with meniscal debridement and meniscal cyst debridement posteriorly.  She had surgery on 11/20/2023.  Overall she is doing very well.  She describes no difficulties.  Not taking any medications for pain.  No restriction to range of motion.  She has returned back to work with no difficulties.  On exam, she has 0 degrees extension and greater than 120 degrees of knee flexion.  Incisions are well-healed.  No calf tenderness.  Negative Homans' sign.  Excellent quad strength rated 5/5.  No significant effusion.  Palp DP pulse.  Plan at this time is follow-up with the office as needed.  She has done a very good job rehabbing her knee and she will return if she has any difficulties.  Follow-Up Instructions: No follow-ups on file.   Orders:  No orders of the defined types were placed in this encounter.  No orders of the defined types were placed in this encounter.   Imaging: No results found.  PMFS History: Patient Active Problem List   Diagnosis Date Noted   Genetic testing 09/18/2020   Family history of breast cancer 09/10/2020   FH: ovarian cancer 09/10/2020   Family history of prostate cancer 09/10/2020   Family history of colon cancer 09/10/2020   Past Medical History:  Diagnosis Date   Anxiety    Family history of breast cancer 09/10/2020   Family history of colon cancer 09/10/2020   Family history of prostate cancer 09/10/2020   FH: ovarian cancer 09/10/2020    Hypertension     Family History  Problem Relation Age of Onset   Hypertension Mother    Hypertension Father    Breast cancer Paternal Aunt 27   Prostate cancer Paternal Uncle 6       metastatic   Breast cancer Maternal Grandmother 29   Heart attack Maternal Grandfather    Colitis Paternal Grandmother    Heart attack Paternal Grandmother    Cancer Paternal Grandmother 22       unknown type; metastatic; ?colon   Heart attack Paternal Grandfather    Ovarian cancer Cousin 16       maternal cousin   Colon cancer Neg Hx    Esophageal cancer Neg Hx    Rectal cancer Neg Hx    Stomach cancer Neg Hx     Past Surgical History:  Procedure Laterality Date   BREAST BIOPSY Left 03/19/2019    FIBROCYSTIC CHANGES WITH CALCIFICATIONS    BREAST LUMPECTOMY WITH RADIOACTIVE SEED LOCALIZATION Bilateral 10/28/2020   Procedure: BILATERAL BREAST LUMPECTOMY WITH RADIOACTIVE SEED LOCALIZATION x2 RIGHT AND X1 LEFT;  Surgeon: [name] Deward MOULD, MD;  Location: [facility];  Service: General;  Laterality: Bilateral;   OTHER SURGICAL HISTORY     IVF   WISDOM TOOTH EXTRACTION     Social History   Occupational History   Not on file  Tobacco Use  Smoking status: Never   Smokeless tobacco: Never  Vaping Use   Vaping status: Never Used  Substance and Sexual Activity   Alcohol use: Yes    Comment: Socially    Drug use: Not Currently   Sexual activity: Yes    Birth control/protection: I.U.D.

## 2024-01-08 ENCOUNTER — Encounter: Payer: Self-pay | Admitting: Hematology and Oncology

## 2024-01-15 ENCOUNTER — Other Ambulatory Visit

## 2024-02-05 ENCOUNTER — Encounter: Payer: Self-pay | Admitting: Radiology

## 2024-02-09 ENCOUNTER — Ambulatory Visit
Admission: RE | Admit: 2024-02-09 | Discharge: 2024-02-09 | Disposition: A | Discharge status: Home / Self Care | Source: Ambulatory Visit | Attending: Hematology and Oncology | Admitting: Hematology and Oncology

## 2024-02-09 ENCOUNTER — Ambulatory Visit: Payer: Self-pay | Admitting: Hematology and Oncology

## 2024-02-09 ENCOUNTER — Other Ambulatory Visit: Payer: Self-pay | Admitting: Hematology and Oncology

## 2024-02-09 DIAGNOSIS — Z803 Family history of malignant neoplasm of breast: Secondary | ICD-10-CM

## 2024-02-09 DIAGNOSIS — Z9189 Other specified personal risk factors, not elsewhere classified: Secondary | ICD-10-CM

## 2024-02-09 MED ORDER — GADOPICLENOL 0.5 MMOL/ML IV SOLN
10.0000 mL | Freq: Once | INTRAVENOUS | Status: AC | PRN
  Administered 2024-02-09: 10 mL via INTRAVENOUS

## 2024-02-12 NOTE — Telephone Encounter (Signed)
-----   Message from [place]yard Iruku sent at 02/09/2024  4:53 PM EST ----- MRI looks without any evidence of malignancy. I also ordered her screening mammogram which is due in March 2026. ----- Message ----- From: Rebecka, Rad Results In Sent: 02/09/2024   2:18 PM EST To: Amber Stalls, MD

## 2024-02-12 NOTE — Telephone Encounter (Signed)
 LVM for patient regarding recent breast MRI results. Provided call back number should patient have any additional questions or concerns.

## 2024-03-18 ENCOUNTER — Telehealth: Payer: Self-pay | Admitting: Hematology and Oncology

## 2024-03-18 NOTE — Telephone Encounter (Signed)
 Left patient a message regarding 04/22/24 appt being rescheduled to 04/23/24. Patient is aware of new appt date and time.

## 2024-04-22 ENCOUNTER — Ambulatory Visit: Payer: 59 | Admitting: Hematology and Oncology

## 2024-04-23 ENCOUNTER — Inpatient Hospital Stay: Attending: Hematology and Oncology | Admitting: Hematology and Oncology

## 2024-04-23 VITALS — BP 144/103 | HR 80 | Temp 97.0°F | Resp 17 | Wt 252.9 lb

## 2024-04-23 DIAGNOSIS — Z8041 Family history of malignant neoplasm of ovary: Secondary | ICD-10-CM | POA: Diagnosis not present

## 2024-04-23 DIAGNOSIS — Z79899 Other long term (current) drug therapy: Secondary | ICD-10-CM | POA: Diagnosis not present

## 2024-04-23 DIAGNOSIS — Z8042 Family history of malignant neoplasm of prostate: Secondary | ICD-10-CM | POA: Insufficient documentation

## 2024-04-23 DIAGNOSIS — Z9189 Other specified personal risk factors, not elsewhere classified: Secondary | ICD-10-CM

## 2024-04-23 DIAGNOSIS — N6091 Unspecified benign mammary dysplasia of right breast: Secondary | ICD-10-CM | POA: Diagnosis present

## 2024-04-23 DIAGNOSIS — Z803 Family history of malignant neoplasm of breast: Secondary | ICD-10-CM | POA: Insufficient documentation

## 2024-04-23 DIAGNOSIS — Z809 Family history of malignant neoplasm, unspecified: Secondary | ICD-10-CM | POA: Diagnosis not present

## 2024-04-23 NOTE — Progress Notes (Signed)
 [name] CONSULT NOTE  Patient Care Team: [name], FNP (Inactive) as PCP - General (Internal Medicine)  CHIEF COMPLAINTS/PURPOSE OF CONSULTATION:  Atypical ductal hyperplasia.  ASSESSMENT & PLAN:  This is a very pleasant 49 year old female patient who was initially seen by atypical ductal hyperplasia back in June and high risk breast clinic who is here for follow-up after bilateral lumpectomy.  No new changes in breasts. PE unremarkable.Current plan is for alternating mammograms and MRIs annually, with twice-yearly clinical breast exams alternating with us  and gynecology. - Order breast MRI and mammogram. - She didn't want to try the tamoxifen for ADH - Encouraged SBE and life style interventions - FU in 1 yr or sooner as needed.  Thank you for consulting us  in the care of this patient.  Please do not hesitate to contact us  with any additional questions or concerns.  HISTORY OF PRESENTING ILLNESS:  Tanya Raymond 49 y.o. female is here because of ADH   Tanya Raymond is a very pleasant 49 yr old female patient with PMH significant for HTN, depression referred to hematology for abnormal mammogram.  Screening mammogram 06/09/2020 IMPRESSION: Further evaluation is suggested for possible mass with calcifications in the right breast.   Further evaluation is suggested for possible mass with calcifications in the left breast.  07/01/20  Diagnostic mammogram : Indeterminate right breast calcifications forming 2-3 distinct groups in the upper outer quadrant. Recommendation is for stereotactic biopsy of the largest group of calcifications. If these demonstrate benign pathology, additional six-month follow-up is recommended for the additional calcifications. 2. Probably benign left breast calcifications. If above pathology demonstrates benign findings, additional six-month follow-up is recommended.  07/03/2020  Right breast needle core biopsy showed  ADH  07/15/2020  Other biopsies showed atypical apocrine hyperplasia and fibrocystic changes.  She had right and left breast lumpectomy on 10/28/2020 by Dr [name]. Pathology showed fibrocystic change, extensive biopsy site changes, no atypia or malignancy in left breast. Right breast showed fibrocystic change with apocrine metaplasia, complex sclerosing lesion, pseudoangiomatous stromal hyperplasia, extensive biopsy site change. No atypia or malignancy.  She had another abnormal mammogram on 06/10/2021 which showed left breast mass. Pathology showed fibrocystic changes, cystic apocrine metaplasia.  Discussed the use of AI scribe software for clinical note transcription with the patient, who gave verbal consent to proceed.  History of Present Illness    Interval history  Discussed the use of AI scribe software for clinical note transcription with the patient, who gave verbal consent to proceed.  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  She had a breast MRI performed in November 2025 and has a mammogram scheduled for March 2026. She reports no new breast symptoms, including absence of pain, palpable masses, or skin changes, and has not detected any abnormalities on self-examination. There is no new family history of cancer.  She previously used Wegovy  for weight management but discontinued due to loss of insurance coverage, resulting in weight regain. She has previously declined tamoxifen for risk reduction.  She reports good sleep and overall well-being.  Rest of the pertinent 10 point ROS reviewed and neg.  MEDICAL HISTORY:  Past Medical History:  Diagnosis Date   Anxiety    Family history of breast cancer 09/10/2020   Family history of colon cancer 09/10/2020   Family history of prostate cancer 09/10/2020   FH: ovarian cancer 09/10/2020   Hypertension  SURGICAL HISTORY: Past Surgical History:  Procedure Laterality Date   BREAST BIOPSY Left 03/19/2019    FIBROCYSTIC CHANGES WITH CALCIFICATIONS    BREAST LUMPECTOMY WITH RADIOACTIVE SEED LOCALIZATION Bilateral 10/28/2020   Procedure: BILATERAL BREAST LUMPECTOMY WITH RADIOACTIVE SEED LOCALIZATION x2 RIGHT AND X1 LEFT;  Surgeon: [name] Deward MOULD, MD;  Location: [facility];  Service: General;  Laterality: Bilateral;   OTHER SURGICAL HISTORY     IVF   WISDOM TOOTH EXTRACTION      SOCIAL HISTORY: Social History   Socioeconomic History   Marital status: Married    Spouse name: Not on file   Number of children: Not on file   Years of education: Not on file   Highest education level: Professional school degree (e.g., MD, DDS, DVM, JD)  Occupational History   Not on file  Tobacco Use   Smoking status: Never   Smokeless tobacco: Never  Vaping Use   Vaping status: Never Used  Substance and Sexual Activity   Alcohol use: Yes    Comment: Socially    Drug use: Not Currently   Sexual activity: Yes    Birth control/protection: I.U.D.  Other Topics Concern   Not on file  Social History Narrative   Not on file   Social Drivers of Health   Tobacco Use: Low Risk (12/31/2023)   Patient History    Smoking Tobacco Use: Never    Smokeless Tobacco Use: Never    Passive Exposure: Not on file  Financial Resource Strain: Low Risk (12/08/2022)   Overall Financial Resource Strain (CARDIA)    Difficulty of Paying Living Expenses: Not hard at all  Food Insecurity: No Food Insecurity (12/08/2022)   Hunger Vital Sign    Worried About Running Out of Food in the Last Year: Never true    Ran Out of Food in the Last Year: Never true  Transportation Needs: No Transportation Needs (12/08/2022)   PRAPARE - Administrator, Civil Service (Medical): No    Lack of Transportation (Non-Medical): No  Physical Activity: Insufficiently Active (12/08/2022)   Exercise Vital Sign    Days of  Exercise per Week: 4 days    Minutes of Exercise per Session: 30 min  Stress: No Stress Concern Present (12/08/2022)   [employer] of Occupational Health - Occupational Stress Questionnaire    Feeling of Stress : Only a little  Social Connections: Moderately Integrated (12/08/2022)   Social Connection and Isolation Panel    Frequency of Communication with Friends and Family: More than three times a week    Frequency of Social Gatherings with Friends and Family: Once a week    Attends Religious Services: Never    Database Administrator or Organizations: Yes    Attends Banker Meetings: Never    Marital Status: Married  Catering Manager Violence: Not on file  Depression (PHQ2-9): Low Risk (12/09/2022)   Depression (PHQ2-9)    PHQ-2 Score: 0  Alcohol Screen: Low Risk (12/08/2022)   Alcohol Screen    Last Alcohol Screening Score (AUDIT): 2  Housing: Low Risk (12/08/2022)   Housing    Last Housing Risk Score: 0  Utilities: Not on file  Health Literacy: Not on file    FAMILY HISTORY: Family History  Problem Relation Age of Onset   Hypertension Mother    Hypertension Father    Breast cancer Paternal Aunt 4   Prostate cancer Paternal Uncle 80       metastatic  Breast cancer Maternal Grandmother 70   Heart attack Maternal Grandfather    Colitis Paternal Grandmother    Heart attack Paternal Grandmother    Cancer Paternal Grandmother 82       unknown type; metastatic; ?colon   Heart attack Paternal Grandfather    Ovarian cancer Cousin 56       maternal cousin   Colon cancer Neg Hx    Esophageal cancer Neg Hx    Rectal cancer Neg Hx    Stomach cancer Neg Hx     ALLERGIES:  has no known allergies.  MEDICATIONS:  Current Outpatient Medications  Medication Sig Dispense Refill   Biotin 89999 MCG TABS Take by mouth.     celecoxib  (CELEBREX ) 100 MG capsule Take 1 capsule (100 mg total) by mouth 2 (two) times daily. 60 capsule 0   escitalopram  (LEXAPRO ) 10 MG tablet  TAKE 1 TABLET BY MOUTH EVERY DAY 90 tablet 3   levonorgestrel  (MIRENA ) 20 MCG/24HR IUD 1 each by Intrauterine route once.     loratadine (CLARITIN) 10 MG tablet Take 10 mg by mouth daily.     losartan -hydrochlorothiazide (HYZAAR) 100-25 MG tablet TAKE 1 TABLET BY MOUTH EVERY DAY 90 tablet 3   MAGNESIUM PO Take by mouth.     methocarbamol  (ROBAXIN ) 500 MG tablet Take 1 tablet (500 mg total) by mouth every 8 (eight) hours as needed for muscle spasms. (Patient not taking: Reported on 12/25/2023) 30 tablet 1   Multiple Vitamin (MULTIVITAMIN PO) Take by mouth.     oxyCODONE  (ROXICODONE ) 5 MG immediate release tablet Take 1 tablet (5 mg total) by mouth every 4 (four) hours as needed for severe pain (pain score 7-10). (Patient not taking: Reported on 12/25/2023) 30 tablet 0   polyethylene glycol-electrolytes (NULYTELY) 420 g solution as directed.     VITAMIN D  PO Take by mouth.     No current facility-administered medications for this visit.     PHYSICAL EXAMINATION:  ECOG PERFORMANCE STATUS: 0 - Asymptomatic  Vitals:   04/23/24 1110  BP: (!) 154/102  Pulse: 80  Resp: 17  Temp: (!) 97 F (36.1 C)  SpO2: 99%    Filed Weights   04/23/24 1110  Weight: 252 lb 14.4 oz (114.7 kg)    Physical Exam Constitutional:      Appearance: Normal appearance.  HENT:     Head: Normocephalic and atraumatic.  Chest:     Comments: Bilateral breast examined.  No palpable masses or regional adenopathy Musculoskeletal:     Cervical back: Normal range of motion and neck supple. No rigidity.  Lymphadenopathy:     Cervical: No cervical adenopathy.  Neurological:     Mental Status: She is alert.     LABORATORY DATA:  I have reviewed the data as listed Lab Results  Component Value Date   WBC 6.3 12/09/2022   HGB 13.1 12/09/2022   HCT 38.6 12/09/2022   MCV 92.8 12/09/2022   PLT 283 12/09/2022     Chemistry      Component Value Date/Time   NA 138 12/09/2022 1613   K 3.6 12/09/2022 1613   CL  102 12/09/2022 1613   CO2 25 12/09/2022 1613   BUN 13 12/09/2022 1613   CREATININE 0.79 12/09/2022 1613      Component Value Date/Time   CALCIUM 9.2 12/09/2022 1613   ALKPHOS 79 03/05/2021 0859   AST 20 12/09/2022 1613   ALT 29 12/09/2022 1613   BILITOT 0.3 12/09/2022 1613  MRI without any evidence of malignancy.  Routine annual screening with mammography and breast MRI recommended.  Patient is due for her next screening mammogram in March 2023.  RADIOGRAPHIC STUDIES:  I have personally reviewed the radiological images as listed and agreed with the findings in the report. No results found.  All questions were answered. The patient knows to call the clinic with any problems, questions or concerns.  I have spent 20 minutes in the care of the patient, discussed pathology results,  role of annual MRI screening, lifetime risk of breast cancer and other measures for risk reduction   Amber Stalls, MD 04/23/2024 11:12 AM

## 2024-05-01 NOTE — Telephone Encounter (Signed)
 Dr Addie called--signing encounter

## 2024-06-10 ENCOUNTER — Ambulatory Visit

## 2024-09-05 ENCOUNTER — Encounter: Admitting: Nurse Practitioner

## 2025-04-24 ENCOUNTER — Inpatient Hospital Stay: Admitting: Hematology and Oncology
